# Patient Record
Sex: Female | Born: 1955 | Race: White | Hispanic: No | Smoking: Former smoker
Health system: Southern US, Community
[De-identification: ages and names within clinical notes are randomized; demographics above are authoritative.]

## PROBLEM LIST (undated history)

## (undated) DIAGNOSIS — E079 Disorder of thyroid, unspecified: Secondary | ICD-10-CM

## (undated) DIAGNOSIS — L900 Lichen sclerosus et atrophicus: Secondary | ICD-10-CM

## (undated) HISTORY — PX: SEPTOPLASTY: SUR1290

## (undated) HISTORY — PX: OTHER SURGICAL HISTORY: SHX169

## (undated) HISTORY — DX: Disorder of thyroid, unspecified: E07.9

## (undated) HISTORY — DX: Lichen sclerosus et atrophicus: L90.0

---

## 2009-03-08 ENCOUNTER — Encounter: Payer: Self-pay | Admitting: Family Medicine

## 2009-03-08 DIAGNOSIS — E039 Hypothyroidism, unspecified: Secondary | ICD-10-CM | POA: Insufficient documentation

## 2009-03-08 DIAGNOSIS — M79609 Pain in unspecified limb: Secondary | ICD-10-CM | POA: Insufficient documentation

## 2010-03-26 NOTE — Assessment & Plan Note (Signed)
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 CONTINUED TO WORK. AS THE DAY CONTINUED THE PAIN INCREASED. STATES SHE CANNOT GRIP OR LIFT WITH HER LEFT HAND. SHE ALSO HAS SOME STIFFNESS IN HER LEFT ELBOW AND SHOULDER. DENIES ANY NUMBNESS OR TINGLING.   Current Problems: HAND PAIN, LEFT (ICD-729.5) FALL FROM OTHER SLIPPING TRIPPING OR STUMBLING (ICD-E885.9) HYPOTHYROIDISM (ICD-244.9)   Current Meds LEVOTHYROXINE SODIUM 125 MCG TABS (LEVOTHYROXINE SODIUM) 1 tab by mouth once daily  REVIEW OF SYSTEMS Constitutional Symptoms      Denies fever, chills, night sweats, weight loss, weight gain, and fatigue.  Eyes       Denies change in vision, eye pain, eye discharge, glasses, contact lenses, and eye surgery. Ear/Nose/Throat/Mouth       Denies hearing loss/aids, change in hearing, ear pain, ear discharge, dizziness, frequent runny nose, frequent nose bleeds, sinus problems, sore throat, hoarseness, and tooth pain or bleeding.  Respiratory       Denies dry cough, productive cough, wheezing, shortness of breath, asthma, bronchitis, and emphysema/COPD.  Cardiovascular       Denies murmurs, chest pain, and tires easily with exhertion.    Gastrointestinal       Denies stomach pain, nausea/vomiting, diarrhea, constipation, blood in bowel movements, and indigestion. Genitourniary       Denies painful urination, kidney stones, and  loss of urinary control. Neurological       Denies paralysis, seizures, and fainting/blackouts. Musculoskeletal       Complains of muscle pain, decreased range of motion, redness, and swelling.      Denies joint pain, joint stiffness, muscle weakness, and gout.      Comments: L wrist, elbow, shoulder Skin       Denies bruising, unusual mles/lumps or sores, and hair/skin or nail changes.  Psych       Denies mood changes, temper/anger issues, anxiety/stress, speech problems, depression, and sleep problems. 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   Past History:  Past Medical History: Hypothyroidism Physical Exam General appearance: well developed, well nourished, no acute distress Extremities: PAIN AND SWELLING DORSAL LEFT WRIST AND HAND. ROM GROSSLY INTACT BUT PAINFUL. N/V INTACT. PAIN WITH GRIPPING AND WITH SUPINATION AND PRONATION. NO PAIN TO PALP OVER THE RADIAL HEAD. EVAL OF THE LEFT SHOULDER REVEALS STIFFNESS BUT INTACT.  Neurological: grossly intact and non-focal WILL CHECK XRAY OR THE LEFT WRIST AND HAND  XRAYS APPERAR NEGATIVE Assessment New Problems: HAND PAIN, LEFT (ICD-729.5) FALL FROM OTHER SLIPPING TRIPPING OR STUMBLING (ICD-E885.9) HYPOTHYROIDISM (ICD-244.9)   Plan New Orders: T-DG Hand Complete*L* [73130] T-*Unlisted Diagnostic X-ray test/procedure [58527] New Patient Level III [  99203]    Patient Instructions: 1)  WEAR SPLINT FOR COMFORT. APPLLY ICE INTERMITTANTLY. MAY DO REG DUTY AST TOLERATED. MOTRIN OR ALEVE AS NEEDED. FOLLOW UP AS NEEDED.

## 2010-08-12 ENCOUNTER — Encounter: Payer: Self-pay | Admitting: Family Medicine

## 2010-08-12 ENCOUNTER — Inpatient Hospital Stay (INDEPENDENT_AMBULATORY_CARE_PROVIDER_SITE_OTHER)
Admission: RE | Admit: 2010-08-12 | Discharge: 2010-08-12 | Disposition: A | Discharge status: Home / Self Care | Payer: Federal, State, Local not specified - PPO | Source: Ambulatory Visit | Attending: Family Medicine | Admitting: Family Medicine

## 2010-08-12 DIAGNOSIS — H6990 Unspecified Eustachian tube disorder, unspecified ear: Secondary | ICD-10-CM | POA: Insufficient documentation

## 2010-08-12 DIAGNOSIS — J019 Acute sinusitis, unspecified: Secondary | ICD-10-CM

## 2010-08-12 DIAGNOSIS — H6980 Other specified disorders of Eustachian tube, unspecified ear: Secondary | ICD-10-CM

## 2010-08-12 DIAGNOSIS — J00 Acute nasopharyngitis [common cold]: Secondary | ICD-10-CM

## 2011-01-27 NOTE — Progress Notes (Signed)
Summary: SORE THROAT,EARACHES,HEADACHE rm 4   Vital Signs:  Patient Profile:   55 Years Old Female CC:      sore throat, ear ache, and HA  x 2 days Height:     67 inches Weight:      203.50 pounds O2 Sat:      98 % O2 treatment:    Room Air Temp:     97.9 degrees F oral Pulse rate:   69 / minute Resp:     18 per minute BP sitting:   126 / 86  (left arm) Cuff size:   regular  Vitals Entered By: Clemens Catholic LPN (August 12, 2010 4:59 PM)                  Prior Medication List:  LEVOTHYROXINE SODIUM 125 MCG TABS (LEVOTHYROXINE SODIUM) 1 tab by mouth once daily   Updated Prior Medication List: LEVOTHYROXINE SODIUM 75 MCG TABS (LEVOTHYROXINE SODIUM)   Current Allergies (reviewed today): No known allergies History of Present Illness Chief Complaint: sore throat, ear ache, and HA  x 2 days History of Present Illness: Sore throatt nasal congestion started Saturday actually felt better yesterday and the today she had arebound and started felling worse today. She reports swollen lymph node on her L neck that has felt like it has gotten a lot worse.  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) ACUTE SINUSITIS, UNSPECIFIED (ICD-461.9) ACUTE NASOPHARYNGITIS (ICD-460) HAND PAIN, LEFT (ICD-729.5) FALL FROM OTHER SLIPPING TRIPPING OR STUMBLING (ICD-E885.9) HYPOTHYROIDISM (ICD-244.9)   Current Meds LEVOTHYROXINE SODIUM 75 MCG TABS (LEVOTHYROXINE SODIUM)  AUGMENTIN 875-125 MG TABS (AMOXICILLIN-POT CLAVULANATE) 1 by mouth 2 times daily DIFLUCAN 200 MG TABS (FLUCONAZOLE) 1 by mouth beginning of antibiotic treatment and repeat if needed ALLEGRA-D ALLERGY & CONGESTION 180-240 MG XR24H-TAB (FEXOFENADINE-PSEUDOEPHEDRINE) 1 by mouth q day FLONASE 50 MCG/ACT SUSP (FLUTICASONE PROPIONATE) 2 puffs each nostril q day  REVIEW OF SYSTEMS Constitutional Symptoms       Complains of fever, chills, and night sweats.     Denies weight loss, weight gain, and fatigue.  Eyes       Denies change in vision, eye pain, eye discharge, glasses, contact lenses, and eye surgery. Ear/Nose/Throat/Mouth       Complains of ear pain, frequent runny nose, sinus problems, and sore throat.      Denies hearing loss/aids, change in hearing, ear discharge, dizziness, frequent nose bleeds, hoarseness, and tooth pain or bleeding.  Respiratory       Denies dry cough, productive cough, wheezing, shortness of breath, asthma, bronchitis, and emphysema/COPD.  Cardiovascular       Complains of chest pain.      Denies murmurs and tires easily with exhertion.    Gastrointestinal       Denies stomach pain, nausea/vomiting, diarrhea, constipation, blood in bowel movements, and indigestion. Genitourniary       Denies painful urination, kidney stones, and loss of urinary control. Neurological       Complains of headaches.      Denies paralysis, seizures, and fainting/blackouts. Musculoskeletal       Denies muscle pain, joint pain, joint stiffness, decreased range of motion, redness, swelling, muscle weakness, and gout.  Skin       Denies bruising, unusual mles/lumps or sores, and hair/skin or nail changes.  Psych       Denies mood changes, temper/anger issues, anxiety/stress, speech problems, depression, and sleep problems. Blood-Lymph       Complains of unexplained lumps. Other Comments:  pt c/o sore throat, swollen lymph node on LT side of her neck, head congestion and HA x 2 days.   Past History:  Family History: Last updated: 08/12/2010 father- kidney failure, heart blockage, prostate CA- deceased age 21  Social History: Last updated: 08/12/2010 Stopped Smoking 6 years ago Alcohol use-yes 2-3 per wk Drug use-no  Risk Factors: Smoking Status: quit > 6 months (08/12/2010)  Past Medical History: Reviewed history from 03/08/2009 and no changes required. Hypothyroidism  Past Surgical History: Caesarean section x 2 86'/93'    Family History: Reviewed history and no changes  required. father- kidney failure, heart blockage, prostate CA- deceased age 7  Social History: Reviewed history and no changes required. 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  Plan New Medications/Changes: FLONASE 50 MCG/ACT SUSP (FLUTICASONE PROPIONATE) 2 puffs each nostril q day  #1 x 0, 08/12/2010, Hassan Rowan MD ALLEGRA-D ALLERGY & CONGESTION 180-240 MG XR24H-TAB (FEXOFENADINE-PSEUDOEPHEDRINE) 1 by mouth q day  #30 x 0, 08/12/2010, Hassan Rowan MD DIFLUCAN 200 MG TABS (FLUCONAZOLE) 1 by mouth beginning of antibiotic treatment and repeat if needed  #2 x 0, 08/12/2010, Hassan Rowan MD AUGMENTIN [phone] MG TABS (AMOXICILLIN-POT CLAVULANATE) 1 by mouth 2 times daily  #20 x 0, 08/12/2010, Hassan Rowan MD  New Orders: New Patient Level III [14782] Rapid Strep [95621] Planning Comments:   as below  Follow Up: Follow up in 2-3 days if no improvement, Follow up on an as needed basis, Follow up with Primary Physician Work/School Excuse: Return to work/school in 2 days  The patient and/or caregiver has been counseled thoroughly with regard to medications prescribed including dosage, schedule, interactions, rationale for use, and possible  side effects and they verbalize understanding.  Diagnoses and expected course of recovery discussed and will return if not improved as expected or if the condition worsens. Patient and/or caregiver verbalized understanding.  Prescriptions: FLONASE 50 MCG/ACT SUSP (FLUTICASONE PROPIONATE) 2 puffs each nostril q day  #1 x 0   Entered and Authorized by:   Hassan Rowan MD   Signed by:   Hassan Rowan MD on 08/12/2010   Method used:   Print then Give to Patient   RxID:   [id number] ALLEGRA-D ALLERGY & CONGESTION 180-240 MG XR24H-TAB (FEXOFENADINE-PSEUDOEPHEDRINE) 1 by mouth q day  #30 x 0   Entered and Authorized by:   Hassan Rowan MD   Signed by:   Hassan Rowan MD on 08/12/2010   Method used:   Print then Give to Patient   RxID:   [id number] DIFLUCAN 200 MG TABS (FLUCONAZOLE) 1 by mouth beginning of antibiotic treatment and repeat if needed  #2 x 0   Entered and Authorized by:   Hassan Rowan MD   Signed by:   Hassan Rowan MD on 08/12/2010   Method used:   Print then Give to Patient  RxID:   [id number] AUGMENTIN 875-125 MG TABS (AMOXICILLIN-POT CLAVULANATE) 1 by mouth 2 times daily  #20 x 0   Entered and Authorized by:   Hassan Rowan MD   Signed by:   Hassan Rowan MD on 08/12/2010   Method used:   Print then Give to Patient   RxID:   [id number]   Patient Instructions: 1)  Recommended remaining out of work for tomorrow and Wednesday if needed. If better on Wednesday may return. 2)  Take your antibiotic as prescribed until ALL of it is gone, but stop if you develop a rash or swelling and contact our office as soon as possible. 3)  Acute sinusitis symptoms for less than 10 days are not helped by antibiotics.Use warm moist compresses, and over the counter decongestants ( only as directed). Call if no improvement in 5-7 days, sooner if increasing pain, fever, or new symptoms. 4)  Please schedule a follow-up appointment as needed. 5)  Please schedule an appointment with your primary  doctor in :7-14 days if not better.  Orders Added: 1)  New Patient Level III [99203] 2)  Rapid Strep [74259]    Laboratory Results  Date/Time Received: August 12, 2010 5:25 PM  Date/Time Reported: August 12, 2010 5:25 PM   Other Tests  Rapid Strep: negative  Kit Test Internal QC: Negative   (Normal Range: Negative)   Preventive Screening-Counseling & Management  Alcohol-Tobacco     Smoking Status: quit > 6 months      Drug Use:  no.

## 2011-01-27 NOTE — Letter (Signed)
Summary: Out of Work  [facility]  [address]   [place], [place] [address]   Phone: [phone]  Fax: [phone]    August 12, 2010   Employee:  Kristy Jones    To Whom It May Concern:   For Medical reasons, please excuse the above named employee from work for the following dates:  Start:   08/12/2010  Return back to work if earliest on 06/20 and no latwer than 08/15/2010:    If you need additional information, please feel free to contact our office.         Sincerely,    Hassan Rowan MD

## 2011-06-06 ENCOUNTER — Ambulatory Visit (INDEPENDENT_AMBULATORY_CARE_PROVIDER_SITE_OTHER): Payer: Federal, State, Local not specified - PPO | Admitting: Physician Assistant

## 2011-06-06 ENCOUNTER — Encounter: Payer: Self-pay | Admitting: Physician Assistant

## 2011-06-06 ENCOUNTER — Other Ambulatory Visit: Payer: Self-pay | Admitting: Physician Assistant

## 2011-06-06 VITALS — BP 122/83 | HR 72 | Ht 67.5 in | Wt 207.0 lb

## 2011-06-06 DIAGNOSIS — K5900 Constipation, unspecified: Secondary | ICD-10-CM

## 2011-06-06 DIAGNOSIS — F419 Anxiety disorder, unspecified: Secondary | ICD-10-CM

## 2011-06-06 DIAGNOSIS — Z7689 Persons encountering health services in other specified circumstances: Secondary | ICD-10-CM

## 2011-06-06 DIAGNOSIS — E039 Hypothyroidism, unspecified: Secondary | ICD-10-CM

## 2011-06-06 DIAGNOSIS — F411 Generalized anxiety disorder: Secondary | ICD-10-CM

## 2011-06-06 DIAGNOSIS — G4700 Insomnia, unspecified: Secondary | ICD-10-CM

## 2011-06-06 MED ORDER — TRAZODONE HCL 50 MG PO TABS: 50.0000 mg | ORAL_TABLET | Freq: Every day | ORAL | Status: DC

## 2011-06-06 MED ORDER — CLONAZEPAM 0.25 MG PO TBDP: 0.2500 mg | ORAL_TABLET | Freq: Two times a day (BID) | ORAL | Status: DC | PRN

## 2011-06-06 NOTE — Patient Instructions (Addendum)
Miralax with stool softeners. Will call with lab results. Start Trazodone at night to help with sleep and anxiety. Can also use Clonazepam as needed up to twice a day for anxiety. Follow up in 6-8 weeks.

## 2011-06-07 LAB — THYROID PANEL WITH TSH
Free Thyroxine Index: 1.5 (ref 1.0–3.9)
T3 Uptake: 37.4 % — ABNORMAL HIGH (ref 22.5–37.0)
T4, Total: 4.1 ug/dL — ABNORMAL LOW (ref 5.0–12.5)
TSH: 3.233 u[IU]/mL (ref 0.350–4.500)

## 2011-06-09 NOTE — Progress Notes (Signed)
  Subjective:    Patient ID: Kristy Jones, female    DOB: Jul 14, 1955, 56 y.o.   MRN: [id number]  HPI Patient presents to establish care. She has a dx of hypothyroidism but takes a natural medication T-100 from [facility] store that has iodine in it and not levothyroxine. She cut back to 1 tab because 2 tabs was making her too hyperthryoid.She needs bloodwork today. She is also having a lot of problems with sleep and anxiety. Once she gets to sleep she is able to stay asleep but hard to shut down. She has recently felt more anxious and in the past took klonapin. She has recently had some constipation. She has not tried anything to make better.    Review of Systems     Objective:   Physical Exam  Constitutional: She is oriented to person, place, and time. She appears well-developed and well-nourished.  HENT:  Head: Normocephalic and atraumatic.  Cardiovascular: Normal rate, regular rhythm and normal heart sounds.   Pulmonary/Chest: Effort normal and breath sounds normal. She has no wheezes.  Abdominal: Soft. Bowel sounds are normal. She exhibits no distension. There is no tenderness.  Neurological: She is alert and oriented to person, place, and time.  Skin: Skin is warm and dry.  Psychiatric: She has a normal mood and affect. Her behavior is normal.          Assessment & Plan:  Hypothyroidism-Thyroid panel was ordered will call with result. She takes Iodine(T-100) 1 tab daily.  Constipation-Miralax with stool softeners may help.   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 Trazodone was given to help with both. Gave klonapin to take only as needed no more than twice a day for anxiety. Follow up in 6-8 weeks.

## 2011-06-10 ENCOUNTER — Telehealth: Payer: Self-pay | Admitting: *Deleted

## 2011-06-10 NOTE — Telephone Encounter (Signed)
Pt called for results of her thyroid panel. Please advise.

## 2011-06-10 NOTE — Telephone Encounter (Signed)
Pt informed. Mailed copy of lab results per pt request to [address] [place], [place] [address]

## 2011-06-10 NOTE — Telephone Encounter (Signed)
Let patient know TSH and reverse t3 is within normal limits. Total t4 and t3 uptake minimally outside of range. Continue on T-100 supplement and need to recheck in 6 months.

## 2011-07-23 ENCOUNTER — Other Ambulatory Visit: Payer: Self-pay | Admitting: *Deleted

## 2011-07-23 MED ORDER — CLONAZEPAM 0.25 MG PO TBDP: 0.2500 mg | ORAL_TABLET | Freq: Two times a day (BID) | ORAL | Status: DC | PRN

## 2011-07-24 ENCOUNTER — Encounter: Payer: Self-pay | Admitting: *Deleted

## 2011-08-01 ENCOUNTER — Ambulatory Visit (INDEPENDENT_AMBULATORY_CARE_PROVIDER_SITE_OTHER): Payer: Federal, State, Local not specified - PPO | Admitting: Physician Assistant

## 2011-08-01 ENCOUNTER — Encounter: Payer: Self-pay | Admitting: Physician Assistant

## 2011-08-01 VITALS — BP 108/72 | HR 60 | Ht 67.5 in | Wt 188.0 lb

## 2011-08-01 DIAGNOSIS — F419 Anxiety disorder, unspecified: Secondary | ICD-10-CM

## 2011-08-01 DIAGNOSIS — F411 Generalized anxiety disorder: Secondary | ICD-10-CM

## 2011-08-01 DIAGNOSIS — E039 Hypothyroidism, unspecified: Secondary | ICD-10-CM

## 2011-08-01 DIAGNOSIS — Z13220 Encounter for screening for lipoid disorders: Secondary | ICD-10-CM

## 2011-08-01 DIAGNOSIS — Z79899 Other long term (current) drug therapy: Secondary | ICD-10-CM

## 2011-08-01 DIAGNOSIS — K5900 Constipation, unspecified: Secondary | ICD-10-CM

## 2011-08-01 LAB — LIPID PANEL
Cholesterol: 152 mg/dL (ref 0–200)
HDL: 49 mg/dL (ref >39)
Total CHOL/HDL Ratio: 3.1 Ratio
VLDL: 14 mg/dL (ref 0–40)

## 2011-08-01 LAB — T3 UPTAKE: T3 Uptake: 41 % — ABNORMAL HIGH (ref 22.5–37.0)

## 2011-08-01 LAB — T4, FREE: Free T4: 0.83 ng/dL (ref 0.80–1.80)

## 2011-08-01 MED ORDER — AMBULATORY NON FORMULARY MEDICATION: Status: DC

## 2011-08-01 MED ORDER — CLONAZEPAM 0.25 MG PO TBDP: 0.2500 mg | ORAL_TABLET | Freq: Two times a day (BID) | ORAL | Status: DC | PRN

## 2011-08-01 NOTE — Patient Instructions (Addendum)
Will call with lab results. Continue taking meds. Follow up in 6 months. Only use klonapin as needed.

## 2011-08-01 NOTE — Progress Notes (Signed)
  Subjective:    Patient ID: Kristy Jones, female    DOB: 1955-04-15, 56 y.o.   MRN: [id number]  HPI Patient present to clinic to follow up on hypothyroidism and anxiety. At last visit T4 levels were a little low so patient increase natural iodine supplement to 1 1/2 daily. Will recheck levels today and she if they have changes. Patient reports to be feeling ok despite a lot of stress in her life. Her youngest son has been in and out of prison and causing a lot of problems. She has had to start using klonapin as needed again. She does not use every day but has been using at least 3 times a week. Denies any CP, palpitations, SOB, swelling, or skin changes. She also denies any suicidal thoughts.  Constipation has resolved and doing much better with supplements with magnesium. She is scheduled to have her yearly mammogram and pap in July by Dr. Fredderick Phenix at [facility].    Review of Systems     Objective:   Physical Exam  Constitutional: She is oriented to person, place, and time. She appears well-developed and well-nourished.  HENT:  Head: Normocephalic and atraumatic.  Neck: Normal range of motion. Neck supple. No thyromegaly present.  Cardiovascular: Normal rate, regular rhythm, normal heart sounds and intact distal pulses.   Pulmonary/Chest: Effort normal and breath sounds normal. She has no wheezes.  Neurological: She is alert and oriented to person, place, and time.  Skin: Skin is warm and dry.       No swelling of extremities.  Psychiatric: She has a normal mood and affect. Her behavior is normal.          Assessment & Plan:  Hypothyroidism- Will recheck labs to determine if we need to make changes to supplements. As of now continue on 1 1/2 tabs and we will let you know what to do from here. Follow up in 6 months.   Anxiety- Discussed going on an every day medication during this stressful peroid. She declined at this time. She states that she will only take klonapin as  needed. Klonapin refilled.   Constipation- Doing great on natural supplements that she has found with magnesium in them.   Needs lipid panel and recheck Vit D level since taking Vit d.

## 2011-10-14 ENCOUNTER — Other Ambulatory Visit: Payer: Self-pay | Admitting: Physician Assistant

## 2011-10-16 ENCOUNTER — Other Ambulatory Visit: Payer: Self-pay | Admitting: *Deleted

## 2011-10-16 MED ORDER — TRAZODONE HCL 50 MG PO TABS: 50.0000 mg | ORAL_TABLET | Freq: Every day | ORAL | Status: DC

## 2011-11-05 ENCOUNTER — Other Ambulatory Visit: Payer: Self-pay | Admitting: Physician Assistant

## 2011-11-05 ENCOUNTER — Other Ambulatory Visit: Payer: Self-pay | Admitting: *Deleted

## 2011-11-05 MED ORDER — IBUPROFEN 800 MG PO TABS: 800.0000 mg | ORAL_TABLET | Freq: Three times a day (TID) | ORAL | Status: DC | PRN

## 2011-11-05 NOTE — Telephone Encounter (Signed)
Sent to pharmacy 

## 2011-11-05 NOTE — Telephone Encounter (Signed)
Pt informed

## 2011-11-05 NOTE — Telephone Encounter (Signed)
Pt states that when she seen you that she forgot to mention that she has been taking Ibuprofen 800mg  for her pain. She is asking if we can send an rx to pharmacy b/c she is out. Please advise.

## 2011-12-12 ENCOUNTER — Other Ambulatory Visit: Payer: Self-pay | Admitting: *Deleted

## 2011-12-12 ENCOUNTER — Other Ambulatory Visit: Payer: Self-pay | Admitting: Physician Assistant

## 2011-12-12 MED ORDER — TRAZODONE HCL 50 MG PO TABS: 50.0000 mg | ORAL_TABLET | Freq: Every day | ORAL | Status: DC

## 2012-01-15 ENCOUNTER — Telehealth: Payer: Self-pay | Admitting: *Deleted

## 2012-01-15 ENCOUNTER — Other Ambulatory Visit: Payer: Self-pay | Admitting: Family Medicine

## 2012-01-15 MED ORDER — TRAZODONE HCL 100 MG PO TABS: 100.0000 mg | ORAL_TABLET | Freq: Every day | ORAL | Status: DC

## 2012-01-15 NOTE — Telephone Encounter (Signed)
Pt notified via vm

## 2012-01-15 NOTE — Telephone Encounter (Signed)
OK to increase to 100mg  nightly. Keep dec appt.

## 2012-01-15 NOTE — Telephone Encounter (Signed)
Pt calls and states that thought Lesly Rubenstein was gonna increase the trazadone she takes at bedtime to 100mg  nightly instead of the 50mg . This didn't happen and pt is out because she has been taking 2 of the trazadone 50mg .

## 2012-02-10 ENCOUNTER — Ambulatory Visit: Payer: Federal, State, Local not specified - PPO | Admitting: Family Medicine

## 2012-02-13 ENCOUNTER — Ambulatory Visit (INDEPENDENT_AMBULATORY_CARE_PROVIDER_SITE_OTHER): Payer: Federal, State, Local not specified - PPO | Admitting: Physician Assistant

## 2012-02-13 ENCOUNTER — Ambulatory Visit: Payer: Federal, State, Local not specified - PPO | Admitting: Physician Assistant

## 2012-02-13 ENCOUNTER — Encounter: Payer: Self-pay | Admitting: Physician Assistant

## 2012-02-13 VITALS — BP 140/86 | HR 63 | Wt 209.0 lb

## 2012-02-13 DIAGNOSIS — E039 Hypothyroidism, unspecified: Secondary | ICD-10-CM

## 2012-02-13 DIAGNOSIS — F411 Generalized anxiety disorder: Secondary | ICD-10-CM

## 2012-02-13 DIAGNOSIS — G4700 Insomnia, unspecified: Secondary | ICD-10-CM

## 2012-02-13 DIAGNOSIS — F419 Anxiety disorder, unspecified: Secondary | ICD-10-CM

## 2012-02-13 MED ORDER — CLONAZEPAM 0.5 MG PO TABS: 0.5000 mg | ORAL_TABLET | Freq: Two times a day (BID) | ORAL | Status: DC | PRN

## 2012-02-13 MED ORDER — TRAZODONE HCL 100 MG PO TABS: 100.0000 mg | ORAL_TABLET | Freq: Every day | ORAL | Status: DC

## 2012-02-13 NOTE — Progress Notes (Signed)
  Subjective:    Patient ID: Kristy Jones, female    DOB: Jul 24, 1955, 56 y.o.   MRN: [id number]  HPI Patient presents to the clinic to get refills. Patient feels great today. She is taking T-100 1 and 1/2 tabs daily. Last TSH check was in JUne and was normal.    She also is taking Trazodone for sleep.She reports that works great for sleep and takes every night.   Anxiety is ongoing and  uses klonapin as needed for anxiety. She reports only using 2-3 times a week. She needs refill on all of medication.    Review of Systems     Objective:   Physical Exam  Constitutional: She is oriented to person, place, and time. She appears well-developed and well-nourished.       Overweight  HENT:  Head: Normocephalic and atraumatic.  Eyes: Conjunctivae normal are normal.  Neck: Normal range of motion. Neck supple. No thyromegaly present.  Cardiovascular: Normal rate, regular rhythm and normal heart sounds.   Pulmonary/Chest: Effort normal and breath sounds normal.  Neurological: She is alert and oriented to person, place, and time.  Skin: Skin is warm and dry.  Psychiatric: She has a normal mood and affect. Her behavior is normal.          Assessment & Plan:  Hypothyroidism- Will not recheck today since patient is feeling so good. Stay on T-100 1 tab and 1/2 daily. Will recheck in 6 months.   Insomnia- Refilled Trazodone. Discussed good sleep hygiene.  Anxiety- Refilled klonopin. Discussed to only use when needed for acute attacks. Discussed if starting to have to use every day then need to discuss every day preventative medication.   Discussed flu shot but she wants to go where shot is free so will [facility].

## 2012-02-13 NOTE — Patient Instructions (Addendum)
Stay on 1 and half tabs daily of T-100. Follow up in 6 months.

## 2012-03-15 ENCOUNTER — Other Ambulatory Visit: Payer: Self-pay | Admitting: Physician Assistant

## 2012-06-08 ENCOUNTER — Telehealth: Payer: Self-pay | Admitting: *Deleted

## 2012-06-08 NOTE — Telephone Encounter (Signed)
Pt calls and states her alcoholic son has been on a binge for 4 days and caused her to be stressed out and she doesn't have the money for an office visit.  She wants to know if you will give her a doctor's note for the days she took off work to give to the [employer]. Says they are requiring her to have one for when she was out. Says you know her situation with her son and their issues. Told patient she would have to be seen but she asked if I would just ask you and see

## 2012-06-09 NOTE — Telephone Encounter (Signed)
How many days? I am ok with 1-2 days. But I can only write a note. If they ask me to fill out FMLA paperwork. I cannot do that by law without a office visit to access you. Will the note be sufficient? Also I will not write out anymore than 2 days.

## 2012-08-03 ENCOUNTER — Other Ambulatory Visit: Payer: Self-pay | Admitting: Family Medicine

## 2012-08-03 ENCOUNTER — Telehealth: Payer: Self-pay | Admitting: *Deleted

## 2012-08-03 DIAGNOSIS — E039 Hypothyroidism, unspecified: Secondary | ICD-10-CM

## 2012-08-03 NOTE — Telephone Encounter (Signed)
Labs ordered & faxed

## 2012-08-04 LAB — TSH: TSH: 3.508 u[IU]/mL (ref 0.350–4.500)

## 2012-08-04 LAB — T3 UPTAKE: T3 Uptake: 39.6 % — ABNORMAL HIGH (ref 22.5–37.0)

## 2012-08-04 LAB — T4, FREE: Free T4: 0.75 ng/dL — ABNORMAL LOW (ref 0.80–1.80)

## 2012-08-04 LAB — T3, FREE: T3, Free: 2.9 pg/mL (ref 2.3–4.2)

## 2012-08-09 ENCOUNTER — Ambulatory Visit: Payer: Federal, State, Local not specified - PPO | Admitting: Physician Assistant

## 2012-09-27 ENCOUNTER — Other Ambulatory Visit: Payer: Self-pay | Admitting: Physician Assistant

## 2012-09-27 ENCOUNTER — Ambulatory Visit (INDEPENDENT_AMBULATORY_CARE_PROVIDER_SITE_OTHER): Payer: Federal, State, Local not specified - PPO | Admitting: Physician Assistant

## 2012-09-27 ENCOUNTER — Encounter: Payer: Self-pay | Admitting: Physician Assistant

## 2012-09-27 VITALS — BP 121/75 | HR 85 | Wt 215.0 lb

## 2012-09-27 DIAGNOSIS — F419 Anxiety disorder, unspecified: Secondary | ICD-10-CM

## 2012-09-27 DIAGNOSIS — F411 Generalized anxiety disorder: Secondary | ICD-10-CM

## 2012-09-27 DIAGNOSIS — G4700 Insomnia, unspecified: Secondary | ICD-10-CM

## 2012-09-27 DIAGNOSIS — E039 Hypothyroidism, unspecified: Secondary | ICD-10-CM

## 2012-09-27 MED ORDER — CITALOPRAM HYDROBROMIDE 10 MG PO TABS: 10.0000 mg | ORAL_TABLET | Freq: Every day | ORAL | Status: DC

## 2012-09-27 NOTE — Progress Notes (Signed)
  Subjective:    Patient ID: Kristy Jones, female    DOB: February 28, 1955, 57 y.o.   MRN: [id number]  HPI Patient presents to the clinic to followup on anxiety, hypothyroidism, insomnia.  Anxiety has worsened over the past 3 months. Her son is on the run from the law. She has not seen him in over a month. He is in trouble for drug use and DUI. She is in constant anxiety and stress over his future. She has a lot of problems sleeping. Trazodone does help but she wakes up feeling groggy in the morning. She has not used trazodone in the last 4-6 weeks. She occasionally does use Klonopin patient has tried daily medication for anxiety in the past but is not on anything currently.  Hypothyroidism-patient recently increase to 2 tabs of her natural thyroid medication. Her free T4 was just a little bit low at last visit. We will recheck today to see if we'll have normalize.   Review of Systems     Objective:   Physical Exam  Constitutional: She is oriented to person, place, and time. She appears well-developed and well-nourished.  HENT:  Head: Normocephalic and atraumatic.  Neck: Normal range of motion. Neck supple. No thyromegaly present.  Cardiovascular: Normal rate, regular rhythm and normal heart sounds.   Pulmonary/Chest: Effort normal and breath sounds normal. She has no wheezes.  Neurological: She is alert and oriented to person, place, and time.  Skin: Skin is warm and dry.  Psychiatric:  Patient got multiple times during the encounter talking about her son.          Assessment & Plan:  Anxiety/insomnia- GAD-7 was 15. Started patient on Celexa at bedtime to see if would help her get to sleep muscles control ongoing anxiety issues. Patient aware of side effects of SSRIs. Patient does not want to be on trazodone and does not want to try Ambien at this time. Told her she could try to use Benadryl as needed to also help with sleep. Mention counseling; however, patient has tried numerous times  and said really does not help. Followup in 2 months.  Hypothyroidism-we'll recheck TSH and free T4 today.

## 2012-09-28 LAB — TSH: TSH: 0.037 u[IU]/mL — ABNORMAL LOW (ref 0.350–4.500)

## 2012-10-07 ENCOUNTER — Telehealth: Payer: Self-pay | Admitting: *Deleted

## 2012-10-07 NOTE — Telephone Encounter (Signed)
Pt notified of MD instructions via VM. Barry Dienes, LPN

## 2012-10-07 NOTE — Telephone Encounter (Signed)
I am assuming this is the celexa. Yes, can take 2 a day. Keep f/u appt.

## 2012-10-07 NOTE — Telephone Encounter (Signed)
Pt calls and states that you put her on med to help with her sleep and anxiety and 1 was not working so she is taking 2 of these a day and wanted to know if this was ok to do as she has been doing it.

## 2012-12-13 ENCOUNTER — Ambulatory Visit: Payer: Federal, State, Local not specified - PPO | Admitting: Physician Assistant

## 2012-12-29 ENCOUNTER — Encounter: Payer: Self-pay | Admitting: Physician Assistant

## 2012-12-29 ENCOUNTER — Ambulatory Visit (INDEPENDENT_AMBULATORY_CARE_PROVIDER_SITE_OTHER): Payer: Federal, State, Local not specified - PPO | Admitting: Physician Assistant

## 2012-12-29 VITALS — BP 118/78 | HR 68 | Wt 218.0 lb

## 2012-12-29 DIAGNOSIS — E039 Hypothyroidism, unspecified: Secondary | ICD-10-CM

## 2012-12-29 DIAGNOSIS — Z23 Encounter for immunization: Secondary | ICD-10-CM

## 2012-12-29 DIAGNOSIS — Z13220 Encounter for screening for lipoid disorders: Secondary | ICD-10-CM

## 2012-12-29 DIAGNOSIS — Z131 Encounter for screening for diabetes mellitus: Secondary | ICD-10-CM

## 2012-12-29 LAB — LIPID PANEL
HDL: 56 mg/dL (ref >39)
LDL Cholesterol: 101 mg/dL — ABNORMAL HIGH (ref 0–99)
Triglycerides: 54 mg/dL (ref <150)
VLDL: 11 mg/dL (ref 0–40)

## 2012-12-29 LAB — COMPLETE METABOLIC PANEL WITH GFR
ALT: 13 U/L (ref 0–35)
AST: 15 U/L (ref 0–37)
CO2: 25 mEq/L (ref 19–32)
Calcium: 9.5 mg/dL (ref 8.4–10.5)
Chloride: 106 mEq/L (ref 96–112)
Creat: 0.61 mg/dL (ref 0.50–1.10)
GFR, Est African American: >89 mL/min
Sodium: 141 mEq/L (ref 135–145)
Total Protein: 6.6 g/dL (ref 6.0–8.3)

## 2012-12-29 LAB — TSH: TSH: 0.058 u[IU]/mL — ABNORMAL LOW (ref 0.350–4.500)

## 2012-12-29 LAB — T4, FREE: Free T4: 0.96 ng/dL (ref 0.80–1.80)

## 2012-12-29 MED ORDER — CLONAZEPAM 0.5 MG PO TABS: 0.5000 mg | ORAL_TABLET | Freq: Two times a day (BID) | ORAL | Status: DC | PRN

## 2012-12-29 MED ORDER — IBUPROFEN 800 MG PO TABS: 800.0000 mg | ORAL_TABLET | Freq: Three times a day (TID) | ORAL | Status: DC | PRN

## 2012-12-29 NOTE — Progress Notes (Signed)
  Subjective:    Patient ID: Kristy Jones, female    DOB: 1956-02-25, 57 y.o.   MRN: [id number]  HPI Patient presents to the clinic to follow up and get med refills.   Hypothyroidism- doing well today continues on T-100. She has gained another 3lbs since August. She is wanting to lose weight. She denies watching what she eats or any exercise.   Anxiety/insomnia- she stopped taking celexa due to nausea. She has started melatonin at bedtime and has helped with sleep. She feels much less stressed today. Both boys are doing much better. Oldest luke has just got out of a 90 day rehab program for narcotic addiction and living with her. He is doing well but a daily battle. She continues to take klonapin as needed. Usually a couple times a week.    Review of Systems     Objective:   Physical Exam  Constitutional: She is oriented to person, place, and time. She appears well-developed and well-nourished.  Overweight.  HENT:  Head: Normocephalic and atraumatic.  Neck: Normal range of motion. Neck supple. No thyromegaly present.  Cardiovascular: Normal rate, regular rhythm and normal heart sounds.   Pulmonary/Chest: Effort normal and breath sounds normal.  Neurological: She is alert and oriented to person, place, and time.  Skin: Skin is warm and dry.  Psychiatric: She has a normal mood and affect. Her behavior is normal.          Assessment & Plan:  Hypothyroidism- Recheck Labs.   Insomnia/Anxiety- GAD-7 was 6. Has decreased from 15. Pt feeling much less stressed. ONly using melatonin for sleep. Discussed to start exercising more regularly. Refilled klonapin PRN.   Depression screening- PHQ-9 was 4.   Screening lab slip given.   Mammogram scheduled for next week.   Flu shot today without complications.

## 2012-12-29 NOTE — Addendum Note (Signed)
Addended by: Donne Anon L on: 12/29/2012 10:42 AM   Modules accepted: Orders

## 2013-02-18 ENCOUNTER — Telehealth: Payer: Self-pay | Admitting: *Deleted

## 2013-02-18 ENCOUNTER — Other Ambulatory Visit: Payer: Self-pay | Admitting: Physician Assistant

## 2013-02-18 MED ORDER — ADAPALENE 0.3 % EX GEL: 1.0000 "application " | Freq: Every day | CUTANEOUS | Status: DC

## 2013-02-18 NOTE — Telephone Encounter (Signed)
Ok sent!

## 2013-02-18 NOTE — Telephone Encounter (Signed)
Pt left message asking if you could prescribe her diferen gel 0.3%.  She states that she used this a long time ago.  Please advise.

## 2013-02-18 NOTE — Telephone Encounter (Signed)
Pt.notified

## 2013-02-25 ENCOUNTER — Telehealth: Payer: Self-pay | Admitting: *Deleted

## 2013-02-25 NOTE — Telephone Encounter (Signed)
PA approved for Differin gel. Valid 12/23/12-02/22/14.

## 2013-04-14 ENCOUNTER — Encounter: Payer: Self-pay | Admitting: Physician Assistant

## 2013-05-10 ENCOUNTER — Other Ambulatory Visit: Payer: Self-pay | Admitting: *Deleted

## 2013-05-10 MED ORDER — CLONAZEPAM 0.5 MG PO TABS: 0.5000 mg | ORAL_TABLET | Freq: Two times a day (BID) | ORAL | Status: DC | PRN

## 2013-07-01 ENCOUNTER — Ambulatory Visit: Payer: Federal, State, Local not specified - PPO | Admitting: Physician Assistant

## 2013-07-06 ENCOUNTER — Ambulatory Visit (INDEPENDENT_AMBULATORY_CARE_PROVIDER_SITE_OTHER): Payer: Federal, State, Local not specified - PPO | Admitting: Physician Assistant

## 2013-07-06 ENCOUNTER — Encounter: Payer: Self-pay | Admitting: Physician Assistant

## 2013-07-06 VITALS — BP 125/80 | HR 62 | Ht 67.0 in | Wt 230.0 lb

## 2013-07-06 DIAGNOSIS — Z79899 Other long term (current) drug therapy: Secondary | ICD-10-CM

## 2013-07-06 DIAGNOSIS — K5900 Constipation, unspecified: Secondary | ICD-10-CM

## 2013-07-06 DIAGNOSIS — E039 Hypothyroidism, unspecified: Secondary | ICD-10-CM

## 2013-07-06 LAB — COMPLETE METABOLIC PANEL WITH GFR
ALBUMIN: 4.2 g/dL (ref 3.5–5.2)
ALT: 17 U/L (ref 0–35)
AST: 16 U/L (ref 0–37)
Alkaline Phosphatase: 72 U/L (ref 39–117)
BUN: 15 mg/dL (ref 6–23)
CHLORIDE: 105 meq/L (ref 96–112)
CO2: 26 mEq/L (ref 19–32)
CREATININE: 0.62 mg/dL (ref 0.50–1.10)
Calcium: 9.4 mg/dL (ref 8.4–10.5)
GFR, Est African American: >89 mL/min
GFR, Est Non African American: >89 mL/min
Glucose, Bld: 101 mg/dL — ABNORMAL HIGH (ref 70–99)
POTASSIUM: 4.5 meq/L (ref 3.5–5.3)
Sodium: 138 mEq/L (ref 135–145)
TOTAL PROTEIN: 6.5 g/dL (ref 6.0–8.3)
Total Bilirubin: 0.4 mg/dL (ref 0.2–1.2)

## 2013-07-06 LAB — MAGNESIUM: Magnesium: 2 mg/dL (ref 1.5–2.5)

## 2013-07-06 NOTE — Patient Instructions (Addendum)
Miralax 1 capful every night.   Constipation, Adult Constipation is when a person has fewer than 3 bowel movements a week; has difficulty having a bowel movement; or has stools that are dry, hard, or larger than normal. As people grow older, constipation is more common. If you try to fix constipation with medicines that make you have a bowel movement (laxatives), the problem may get worse. Long-term laxative use may cause the muscles of the colon to become weak. A low-fiber diet, not taking in enough fluids, and taking certain medicines may make constipation worse. CAUSES   Certain medicines, such as antidepressants, pain medicine, iron supplements, antacids, and water pills.   Certain diseases, such as diabetes, irritable bowel syndrome (IBS), thyroid disease, or depression.   Not drinking enough water.   Not eating enough fiber-rich foods.   Stress or travel.  Lack of physical activity or exercise.  Not going to the restroom when there is the urge to have a bowel movement.  Ignoring the urge to have a bowel movement.  Using laxatives too much. SYMPTOMS   Having fewer than 3 bowel movements a week.   Straining to have a bowel movement.   Having hard, dry, or larger than normal stools.   Feeling full or bloated.   Pain in the lower abdomen.  Not feeling relief after having a bowel movement. DIAGNOSIS  Your caregiver will take a medical history and perform a physical exam. Further testing may be done for severe constipation. Some tests may include:   A barium enema X-ray to examine your rectum, colon, and sometimes, your small intestine.  A sigmoidoscopy to examine your lower colon.  A colonoscopy to examine your entire colon. TREATMENT  Treatment will depend on the severity of your constipation and what is causing it. Some dietary treatments include drinking more fluids and eating more fiber-rich foods. Lifestyle treatments may include regular exercise. If these  diet and lifestyle recommendations do not help, your caregiver may recommend taking over-the-counter laxative medicines to help you have bowel movements. Prescription medicines may be prescribed if over-the-counter medicines do not work.  HOME CARE INSTRUCTIONS   Increase dietary fiber in your diet, such as fruits, vegetables, whole grains, and beans. Limit high-fat and processed sugars in your diet, such as Jamaica[place] fries, hamburgers, cookies, candies, and soda.   A fiber supplement may be added to your diet if you cannot get enough fiber from foods.   Drink enough fluids to keep your urine clear or pale yellow.   Exercise regularly or as directed by your caregiver.   Go to the restroom when you have the urge to go. Do not hold it.  Only take medicines as directed by your caregiver. Do not take other medicines for constipation without talking to your caregiver first. SEEK IMMEDIATE MEDICAL CARE IF:   You have bright red blood in your stool.   Your constipation lasts for more than 4 days or gets worse.   You have abdominal or rectal pain.   You have thin, pencil-like stools.  You have unexplained weight loss. MAKE SURE YOU:   Understand these instructions.  Will watch your condition.  Will get help right away if you are not doing well or get worse. Document Released: 11/09/2003 Document Revised: 05/05/2011 Document Reviewed: 11/22/2012 [facility] Patient Information 2014 [employer] [place].

## 2013-07-07 LAB — TSH: TSH: 0.05 u[IU]/mL — ABNORMAL LOW (ref 0.350–4.500)

## 2013-07-07 LAB — T4, FREE: FREE T4: 0.91 ng/dL (ref 0.80–1.80)

## 2013-07-07 LAB — VITAMIN D 25 HYDROXY (VIT D DEFICIENCY, FRACTURES): Vit D, 25-Hydroxy: 57 ng/mL (ref 30–89)

## 2013-07-07 LAB — T3, FREE: T3, Free: 3.4 pg/mL (ref 2.3–4.2)

## 2013-07-07 LAB — VITAMIN B12: Vitamin B-12: 503 pg/mL (ref 211–911)

## 2013-07-08 DIAGNOSIS — K5900 Constipation, unspecified: Secondary | ICD-10-CM | POA: Insufficient documentation

## 2013-07-08 NOTE — Progress Notes (Signed)
   Subjective:    Patient ID: Kristy Jones, female    DOB: 04/25/1955, 58 y.o.   MRN: [id number]  HPI Patient is a 58 year old female who presents to the clinic to followup on hypothyroidism. She is doing well today on her over-the-counter supplement T.-100. She has no complaints or concerns today with how she feels. 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  Patient is on a lot of supplements and is concerned about over supplementing a central nutritions.  She also does complain of some recent increase in constipation. She denies any ferrous sulfate usage. She has noticed that she's going much less frequent. She will go on aver 3 times a week and the stools are hard. She denies any blood or pus in any of her stools. She denies any fever, chills, nausea or vomiting. She does have some occasional stomach cramping. She has started magnesium citrate 500 mg twice a day. She does not notice any improvement with her bowel movements. She's previously been using stool softeners and it helped for a while but now they don't seem to be helping as much anymore.   Review of Systems     Objective:   Physical Exam  Constitutional: She is oriented to person, place, and time. She appears well-developed and well-nourished.  HENT:  Head: Normocephalic and atraumatic.  Neck: Normal range of motion. Neck supple. No thyromegaly present.  Cardiovascular: Normal rate, regular rhythm and normal heart sounds.   Pulmonary/Chest: Effort normal and breath sounds normal.  Neurological: She is alert and oriented to person, place, and time.  Skin: Skin is dry.  Psychiatric: She has a normal mood and affect. Her behavior is normal.          Assessment & Plan:   Hypothyroidism- will recheck labs today. Pt takes OTC supplement to keep thyroid in normal range.   Constipation- encouraged to take miralax 1 capful nightly until complete bowel movement. Gave handout of increasing fiber and water.  If not improving may consider linzess or amitiza.   Medication management- pt is taking a lot of vitamins and supplements. Will check b12, vitamin d, magnesium, cmp to make sure nothing is being oversupplemented.

## 2013-09-05 ENCOUNTER — Other Ambulatory Visit: Payer: Self-pay | Admitting: *Deleted

## 2013-09-05 ENCOUNTER — Other Ambulatory Visit: Payer: Self-pay | Admitting: Physician Assistant

## 2013-09-05 MED ORDER — CLONAZEPAM 0.5 MG PO TABS: 0.5000 mg | ORAL_TABLET | Freq: Two times a day (BID) | ORAL | Status: DC | PRN

## 2013-09-26 ENCOUNTER — Other Ambulatory Visit: Payer: Self-pay | Admitting: Physician Assistant

## 2013-10-24 ENCOUNTER — Telehealth: Payer: Self-pay

## 2013-10-24 MED ORDER — CLONAZEPAM 0.5 MG PO TABS: 0.5000 mg | ORAL_TABLET | Freq: Two times a day (BID) | ORAL | Status: DC | PRN

## 2013-10-24 NOTE — Telephone Encounter (Signed)
Kristy Jones has been taking T-100 supplement for her thyroid. The supplement contains Iodine 220 mcg, Dulse 400 mg, Thyroid gland 100 mg, Adrenal gland 50 mg L-tyrosine 30 mg, Bladderwrack 15 mg, [place] 15 mg, Pituitary gland 15 mg, Thymus gland 5 mg, Calcarea fluorica 6x, Lycopus virginicus 6x. This supplement is no longer manufactured and she has switched to Raw Thyroid and this contains Tyroid Tissue, Adrenal Tissue, Pituitary Tissue, Thymus Tissue, Spleen Tissue, American  Ginseng, Kelp, [place] and Magnesium Stearate. This new medication does not have the Iodine 220 mcg. Should she include an additional Iodine 220 mcg supplement? Or is ok to leave off the Iodine supplement altogether.

## 2013-10-25 NOTE — Telephone Encounter (Signed)
I would include the iodine . After been on for 4-6 weeks need to check thyroid levels to make sure still staying in normal range.

## 2013-10-25 NOTE — Telephone Encounter (Signed)
Patient advised of recommendations.  

## 2013-11-08 ENCOUNTER — Telehealth: Payer: Self-pay | Admitting: *Deleted

## 2013-11-08 DIAGNOSIS — E039 Hypothyroidism, unspecified: Secondary | ICD-10-CM

## 2013-11-08 NOTE — Telephone Encounter (Signed)
Thyroid labs ordered

## 2013-11-09 LAB — T3 UPTAKE: T3 Uptake: 25 % (ref 22.0–35.0)

## 2013-11-10 ENCOUNTER — Telehealth: Payer: Self-pay | Admitting: *Deleted

## 2013-11-10 LAB — T3, FREE: T3, Free: 1.1 pg/mL — ABNORMAL LOW (ref 2.3–4.2)

## 2013-11-10 LAB — TSH: TSH: 52.167 u[IU]/mL — AB (ref 0.350–4.500)

## 2013-11-10 LAB — T4, FREE: Free T4: 0.35 ng/dL — ABNORMAL LOW (ref 0.80–1.80)

## 2013-11-10 NOTE — Telephone Encounter (Signed)
Pt called back to say that the [facility] store in [place] where she used to get the T100 from called her to let her know that they now have an equivalent in & she is going to pick it up this afternoon.  She wants to know if you'd be ok with that.  The T100 is what she was taking for years prior to her becoming your pt.

## 2013-11-11 NOTE — Telephone Encounter (Signed)
Pt.notified

## 2013-11-11 NOTE — Telephone Encounter (Signed)
Go back on it. But recheck in 4-6 weeks. If levels not chaning then we need to try other methods.

## 2013-12-12 ENCOUNTER — Telehealth: Payer: Self-pay | Admitting: *Deleted

## 2013-12-12 DIAGNOSIS — E039 Hypothyroidism, unspecified: Secondary | ICD-10-CM

## 2013-12-12 NOTE — Telephone Encounter (Signed)
Labs ordered.

## 2013-12-17 LAB — TSH: TSH: 36.157 u[IU]/mL — AB (ref 0.350–4.500)

## 2013-12-17 LAB — T4, FREE: Free T4: 0.57 ng/dL — ABNORMAL LOW (ref 0.80–1.80)

## 2013-12-17 LAB — T3, FREE: T3, Free: 2.4 pg/mL (ref 2.3–4.2)

## 2013-12-17 LAB — T3 UPTAKE: T3 Uptake: 26 % (ref 22.0–35.0)

## 2013-12-20 ENCOUNTER — Other Ambulatory Visit: Payer: Self-pay | Admitting: Physician Assistant

## 2013-12-20 MED ORDER — THYROID 30 MG PO TABS: 30.0000 mg | ORAL_TABLET | Freq: Every day | ORAL | Status: DC

## 2014-01-04 ENCOUNTER — Ambulatory Visit: Payer: Federal, State, Local not specified - PPO | Admitting: Physician Assistant

## 2014-01-11 ENCOUNTER — Other Ambulatory Visit: Payer: Self-pay | Admitting: Physician Assistant

## 2014-01-12 ENCOUNTER — Other Ambulatory Visit: Payer: Self-pay | Admitting: Physician Assistant

## 2014-01-18 ENCOUNTER — Telehealth: Payer: Self-pay | Admitting: *Deleted

## 2014-01-18 ENCOUNTER — Ambulatory Visit: Payer: Federal, State, Local not specified - PPO | Admitting: Physician Assistant

## 2014-01-18 DIAGNOSIS — E039 Hypothyroidism, unspecified: Secondary | ICD-10-CM

## 2014-01-18 NOTE — Telephone Encounter (Signed)
Thyroid labs ordered

## 2014-01-24 LAB — TSH: TSH: 21.924 u[IU]/mL — AB (ref 0.350–4.500)

## 2014-01-24 LAB — T4, FREE: Free T4: 0.62 ng/dL — ABNORMAL LOW (ref 0.80–1.80)

## 2014-01-24 LAB — T3, FREE: T3 FREE: 2.4 pg/mL (ref 2.3–4.2)

## 2014-01-25 ENCOUNTER — Other Ambulatory Visit: Payer: Self-pay | Admitting: Physician Assistant

## 2014-01-25 ENCOUNTER — Encounter: Payer: Self-pay | Admitting: Physician Assistant

## 2014-01-25 ENCOUNTER — Ambulatory Visit (INDEPENDENT_AMBULATORY_CARE_PROVIDER_SITE_OTHER): Payer: Federal, State, Local not specified - PPO | Admitting: Physician Assistant

## 2014-01-25 DIAGNOSIS — E038 Other specified hypothyroidism: Secondary | ICD-10-CM | POA: Diagnosis not present

## 2014-01-25 MED ORDER — CLONAZEPAM 0.5 MG PO TABS: 0.5000 mg | ORAL_TABLET | Freq: Two times a day (BID) | ORAL | Status: DC | PRN

## 2014-01-25 MED ORDER — THYROID 60 MG PO TABS: 60.0000 mg | ORAL_TABLET | Freq: Every day | ORAL | Status: DC

## 2014-01-25 NOTE — Progress Notes (Signed)
   Subjective:    Patient ID: Kristy Jones, female    DOB: 03/02/1955, 58 y.o.   MRN: [id number]  HPI Pt presents to the clinic to follow up on hypothyroidism. Her OTC supplement that was controlling her levels was recently taken off market. She is on armour thyroid now. She denies any problems swallowing or feeling of lump in throat.    Review of Systems  All other systems reviewed and are negative.      Objective:   Physical Exam  Constitutional: She is oriented to person, place, and time. She appears well-developed and well-nourished.  HENT:  Head: Normocephalic and atraumatic.  Neck: Neck supple. No thyromegaly present.  Cardiovascular: Normal rate, regular rhythm and normal heart sounds.   Pulmonary/Chest: Effort normal and breath sounds normal.  Neurological: She is alert and oriented to person, place, and time.  Skin: Skin is dry.  Psychiatric: She has a normal mood and affect. Her behavior is normal.          Assessment & Plan:  Hypothyroidism- TSH recently checked still elevated at 21 with free levels still low. Increased armour to 60mg  daily recheck in 4 weeks. Pt given lab slip.   Pt aware need CPE.

## 2014-02-13 ENCOUNTER — Other Ambulatory Visit: Payer: Self-pay | Admitting: Physician Assistant

## 2014-02-28 ENCOUNTER — Other Ambulatory Visit: Payer: Self-pay | Admitting: Physician Assistant

## 2014-02-28 ENCOUNTER — Telehealth: Payer: Self-pay | Admitting: *Deleted

## 2014-02-28 ENCOUNTER — Other Ambulatory Visit: Payer: Self-pay | Admitting: *Deleted

## 2014-02-28 DIAGNOSIS — E039 Hypothyroidism, unspecified: Secondary | ICD-10-CM

## 2014-02-28 LAB — TSH: TSH: 12.103 u[IU]/mL — AB (ref 0.350–4.500)

## 2014-02-28 LAB — T4, FREE: Free T4: 0.67 ng/dL — ABNORMAL LOW (ref 0.80–1.80)

## 2014-02-28 LAB — T3, FREE: T3, Free: 2.4 pg/mL (ref 2.3–4.2)

## 2014-02-28 MED ORDER — THYROID 30 MG PO TABS: ORAL_TABLET | ORAL | Status: DC

## 2014-02-28 MED ORDER — THYROID 90 MG PO TABS: 90.0000 mg | ORAL_TABLET | Freq: Every day | ORAL | Status: DC

## 2014-02-28 NOTE — Telephone Encounter (Signed)
TSH, T4, &  T3 labs ordered.

## 2014-03-24 ENCOUNTER — Emergency Department (INDEPENDENT_AMBULATORY_CARE_PROVIDER_SITE_OTHER)
Admission: EM | Admit: 2014-03-24 | Discharge: 2014-03-24 | Disposition: A | Discharge status: Home / Self Care | Source: Home / Self Care | Attending: Family Medicine | Admitting: Family Medicine

## 2014-03-24 ENCOUNTER — Emergency Department (INDEPENDENT_AMBULATORY_CARE_PROVIDER_SITE_OTHER)

## 2014-03-24 ENCOUNTER — Encounter: Payer: Self-pay | Admitting: Emergency Medicine

## 2014-03-24 DIAGNOSIS — M179 Osteoarthritis of knee, unspecified: Secondary | ICD-10-CM

## 2014-03-24 DIAGNOSIS — Z23 Encounter for immunization: Secondary | ICD-10-CM | POA: Diagnosis not present

## 2014-03-24 DIAGNOSIS — S80212A Abrasion, left knee, initial encounter: Secondary | ICD-10-CM | POA: Diagnosis not present

## 2014-03-24 DIAGNOSIS — S8002XA Contusion of left knee, initial encounter: Secondary | ICD-10-CM | POA: Diagnosis not present

## 2014-03-24 MED ORDER — TETANUS-DIPHTH-ACELL PERTUSSIS 5-2.5-18.5 LF-MCG/0.5 IM SUSP
0.5000 mL | Freq: Once | INTRAMUSCULAR | Status: AC
  Administered 2014-03-24: 0.5 mL via INTRAMUSCULAR

## 2014-03-24 MED ORDER — IBUPROFEN 800 MG PO TABS: ORAL_TABLET | ORAL | Status: DC

## 2014-03-24 NOTE — Discharge Instructions (Signed)
Leave present bandage on for three days; keep clean and dry.  After three days, change bandage daily and apply Bacitracin ointment.  Wear ace wrap until swelling resolves.  Apply ice pack for 20 to 30 minutes, 3 to 4 times daily  Continue until swelling and pain decrease.   Begin knee exercises in about 3 days.

## 2014-03-24 NOTE — ED Notes (Signed)
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 Uncertain of last tetanus vaccination.

## 2014-03-24 NOTE — ED Provider Notes (Addendum)
CSN: [id number]     Arrival date & time 03/24/14  1001 History   First MD Initiated Contact with Patient 03/24/14 1134     Chief Complaint  Patient presents with  . 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 Uncertain of last tetanus vaccination.  Patient has pain when flexing knee     Patient is a 59 y.o. female presenting with knee pain. The history is provided by the patient.  Knee Pain Location:  Knee Time since incident:  3 hours Injury: yes   Mechanism of injury comment:  Bumped anterior knee on jagged edge of cart Knee location:  L knee Pain details:    Quality:  Aching   Radiates to:  Does not radiate   Severity:  Mild   Onset quality:  Sudden   Duration:  3 hours   Timing:  Constant   Progression:  Unchanged Chronicity:  New Foreign body present:  No foreign bodies Tetanus status:  Out of date Prior injury to area:  No Relieved by:  None tried Worsened by:  Bearing weight and flexion Ineffective treatments:  None tried Associated symptoms: decreased ROM, stiffness and swelling   Associated symptoms: no back pain, no muscle weakness, no numbness and no tingling   Risk factors: obesity     Past Medical History  Diagnosis Date  . Thyroid disease   . Lichen sclerosus    Past Surgical History  Procedure Laterality Date  . C-sections    . Septoplasty     Family History  Problem Relation Age of Onset  . Alcohol abuse      grandfather  . Prostate cancer Father   . Depression Mother     sister   History  Substance Use Topics  . Smoking status: Former Smoker    Types: Cigarettes    Quit date: 06/06/2007  . Smokeless tobacco: Not on file  . Alcohol Use: 3.0 oz/week    6 drink(s) per week   OB History    No data available     Review of Systems  Musculoskeletal:  Positive for stiffness. Negative for back pain.  All other systems reviewed and are negative.   Allergies  Vicodin  Home Medications   Prior to Admission medications   Medication Sig Start Date End Date Taking? Authorizing Provider  Adapalene 0.3 % gel Apply 1 application topically at bedtime. Patient taking differently: Apply 1 application topically as needed.  02/18/13   Jade L Breeback, PA-C  Ascorbic Acid (VITAMIN C) 1000 MG tablet Take 1,000 mg by mouth daily.    Historical Provider, MD  Cholecalciferol (VITAMIN D-3 PO) Take 2,000 Units by mouth daily.     Historical Provider, MD  clonazePAM (KLONOPIN) 0.5 MG tablet TAKE 1 TABLET BY MOUTH TWICE DAILY AS NEEDED FOR ANXIETY 02/13/14   Jade L Breeback, PA-C  ibuprofen (ADVIL,MOTRIN) 800 MG tablet Take one tab by mouth every 8 hours with food 03/24/14   Lattie Haw, MD  Magnesium 500 MG CAPS Take by mouth. Take one in the am and one in the pm    Historical Provider, MD  Omega-3 Fatty Acids (FISH OIL) 1000 MG CAPS Take by mouth.    Historical Provider, MD  potassium gluconate 595 MG TABS tablet Take 595 mg by mouth  daily.    Historical Provider, MD  Selenium 100 MCG TABS Take 100 mcg by mouth daily.    Historical Provider, MD  thyroid (ARMOUR THYROID) 30 MG tablet TAKE 1 TABLET BY MOUTH DAILY BEFORE BREAKFAST 02/28/14   Jade L Breeback, PA-C  thyroid (ARMOUR) 90 MG tablet Take 1 tablet (90 mg total) by mouth daily. 02/28/14   Jade L Breeback, PA-C  zinc gluconate 50 MG tablet Take 25 mg by mouth daily.     Historical Provider, MD   BP 107/70 mmHg  Pulse 71  Temp(Src) 98 F (36.7 C) (Oral)  Resp 16  Ht 5\' 7"  (1.702 m)  Wt 212 lb (96.163 kg)  BMI 33.20 kg/m2  SpO2 96% Physical Exam  Constitutional: She is oriented to person, place, and time. She appears well-developed and well-nourished. No distress.  Patient is obese (BMI 33.2)  HENT:  Head: Atraumatic.  Eyes: Conjunctivae are normal. Pupils are equal, round, and reactive to  light.  Musculoskeletal:       Left knee: She exhibits decreased range of motion and swelling. She exhibits no effusion, no ecchymosis, no deformity, no laceration, normal alignment, no LCL laxity and normal patellar mobility. Tenderness found. LCL tenderness noted. No patellar tendon tenderness noted.       Legs: Left knee has a superficial laceration over patella measuring 5mm by 2.5cm as noted on diagram. Knee has mild swelling anteriorly and tenderness to palpation over patella.  There is tenderness over the lateral collateral ligament.  There is mild decreased flexion.  Knee stable.  Negative McMurray.      Neurological: She is alert and oriented to person, place, and time.  Skin: Skin is warm and dry.  Nursing note and vitals reviewed.   ED Course  Procedures  none    Imaging Review Dg Knee Complete 4 Views Left  03/24/2014   CLINICAL DATA:  Acute left knee injury and pain today  EXAM: LEFT KNEE - COMPLETE 4+ VIEW  COMPARISON:  None.  FINDINGS: Normal alignment without acute fracture or effusion. Moderately severe patellofemoral compartment osteoarthritis with spurring, sclerosis and joint space loss. Minor joint space narrowing of the medial and lateral compartments. No definite soft tissue abnormality.  IMPRESSION: Left knee osteoarthritis, most pronounced in the patellofemoral compartment.  No acute osseous finding or effusion.   Electronically Signed   By: Ruel Favorsrevor  Shick M.D.   On: 03/24/2014 12:44     MDM   1. Abrasion of left knee, initial encounter   2. Contusion of left knee, initial encounter    Abrasion left knee cleaned with Betadine and saline.  Bacitracin and Mepelex border dressing applied Ace wrap applied.  Leave present bandage on for three days; keep clean and dry.  After three days, change bandage daily and apply Bacitracin ointment.  Wear ace wrap until swelling resolves.  Apply ice pack for 20 to 30 minutes, 3 to 4 times daily  Continue until swelling and pain  decrease.   Begin knee exercises in about 3 days. [facility]. Remain out of work today. Work restrictions:   Sedentary job only for three days with leg propped up, then resume normal duties.    Lattie HawStephen A Dilraj Killgore, MD 03/24/14 1301  Lattie HawStephen A Jermarion Poffenberger, MD 03/24/14 [phone]

## 2014-03-26 ENCOUNTER — Telehealth: Payer: Self-pay | Admitting: Emergency Medicine

## 2014-04-06 LAB — T4, FREE: Free T4: 0.64 ng/dL — ABNORMAL LOW (ref 0.80–1.80)

## 2014-04-06 LAB — TSH: TSH: 9.359 u[IU]/mL — ABNORMAL HIGH (ref 0.350–4.500)

## 2014-04-06 LAB — T3, FREE: T3, Free: 2.5 pg/mL (ref 2.3–4.2)

## 2014-04-07 ENCOUNTER — Other Ambulatory Visit: Payer: Self-pay | Admitting: Physician Assistant

## 2014-04-07 MED ORDER — THYROID 120 MG PO TABS: 120.0000 mg | ORAL_TABLET | Freq: Every day | ORAL | Status: DC

## 2014-04-25 ENCOUNTER — Encounter: Payer: Self-pay | Admitting: Physician Assistant

## 2014-04-26 ENCOUNTER — Other Ambulatory Visit: Payer: Self-pay | Admitting: Physician Assistant

## 2014-05-08 ENCOUNTER — Telehealth: Payer: Self-pay | Admitting: *Deleted

## 2014-05-08 ENCOUNTER — Other Ambulatory Visit: Payer: Self-pay | Admitting: *Deleted

## 2014-05-08 DIAGNOSIS — E039 Hypothyroidism, unspecified: Secondary | ICD-10-CM

## 2014-05-08 MED ORDER — THYROID 60 MG PO TABS: ORAL_TABLET | ORAL | Status: DC

## 2014-05-08 NOTE — Telephone Encounter (Signed)
TSH, T4, & T3 labs ordered. Minna AntisEbony Brigham, [place]MA

## 2014-05-30 ENCOUNTER — Telehealth: Payer: Self-pay | Admitting: *Deleted

## 2014-05-30 LAB — TSH: TSH: 1.692 u[IU]/mL (ref 0.350–4.500)

## 2014-05-30 LAB — T4, FREE: Free T4: 0.73 ng/dL — ABNORMAL LOW (ref 0.80–1.80)

## 2014-05-30 LAB — T3, FREE: T3 FREE: 4.3 pg/mL — AB (ref 2.3–4.2)

## 2014-05-31 ENCOUNTER — Other Ambulatory Visit: Payer: Self-pay

## 2014-05-31 MED ORDER — THYROID 60 MG PO TABS: ORAL_TABLET | ORAL | Status: DC

## 2014-05-31 MED ORDER — CLONAZEPAM 0.5 MG PO TABS: 0.5000 mg | ORAL_TABLET | Freq: Two times a day (BID) | ORAL | Status: DC | PRN

## 2014-05-31 NOTE — Telephone Encounter (Signed)
Closing previous documentation 

## 2014-06-02 ENCOUNTER — Other Ambulatory Visit: Payer: Self-pay | Admitting: Physician Assistant

## 2014-06-05 ENCOUNTER — Telehealth: Payer: Self-pay | Admitting: Physician Assistant

## 2014-06-05 NOTE — Telephone Encounter (Signed)
Received fax for pa on adapalene gel sent through cover my meds waiting on auth. - CF

## 2014-06-05 NOTE — Telephone Encounter (Signed)
Received fax from [employer] and her adapalene gel has been approved from 04/06/2014 - 06/05/2015. Auth [id number] - CF

## 2014-06-14 ENCOUNTER — Encounter: Payer: Self-pay | Admitting: Physician Assistant

## 2014-06-14 ENCOUNTER — Ambulatory Visit (INDEPENDENT_AMBULATORY_CARE_PROVIDER_SITE_OTHER): Payer: Worker's Compensation

## 2014-06-14 ENCOUNTER — Ambulatory Visit (INDEPENDENT_AMBULATORY_CARE_PROVIDER_SITE_OTHER): Payer: Worker's Compensation | Admitting: Physician Assistant

## 2014-06-14 ENCOUNTER — Encounter: Payer: Self-pay | Admitting: *Deleted

## 2014-06-14 VITALS — BP 125/83 | HR 77 | Ht 67.0 in | Wt 226.0 lb

## 2014-06-14 DIAGNOSIS — M533 Sacrococcygeal disorders, not elsewhere classified: Secondary | ICD-10-CM

## 2014-06-14 DIAGNOSIS — M7071 Other bursitis of hip, right hip: Secondary | ICD-10-CM

## 2014-06-14 DIAGNOSIS — M545 Low back pain, unspecified: Secondary | ICD-10-CM

## 2014-06-14 DIAGNOSIS — M2578 Osteophyte, vertebrae: Secondary | ICD-10-CM

## 2014-06-14 DIAGNOSIS — M5136 Other intervertebral disc degeneration, lumbar region: Secondary | ICD-10-CM

## 2014-06-14 DIAGNOSIS — M4806 Spinal stenosis, lumbar region: Secondary | ICD-10-CM

## 2014-06-14 DIAGNOSIS — M4807 Spinal stenosis, lumbosacral region: Secondary | ICD-10-CM | POA: Diagnosis not present

## 2014-06-14 DIAGNOSIS — M7070 Other bursitis of hip, unspecified hip: Secondary | ICD-10-CM | POA: Insufficient documentation

## 2014-06-14 DIAGNOSIS — M5137 Other intervertebral disc degeneration, lumbosacral region: Secondary | ICD-10-CM

## 2014-06-14 MED ORDER — CYCLOBENZAPRINE HCL 10 MG PO TABS: 10.0000 mg | ORAL_TABLET | Freq: Three times a day (TID) | ORAL | Status: DC | PRN

## 2014-06-14 MED ORDER — DICLOFENAC SODIUM 2 % TD SOLN: TRANSDERMAL | Status: DC

## 2014-06-14 MED ORDER — TRAMADOL HCL 50 MG PO TABS: 50.0000 mg | ORAL_TABLET | Freq: Three times a day (TID) | ORAL | Status: DC | PRN

## 2014-06-16 ENCOUNTER — Encounter: Payer: Self-pay | Admitting: *Deleted

## 2014-06-18 DIAGNOSIS — M47816 Spondylosis without myelopathy or radiculopathy, lumbar region: Secondary | ICD-10-CM | POA: Insufficient documentation

## 2014-06-18 DIAGNOSIS — M545 Low back pain, unspecified: Secondary | ICD-10-CM | POA: Insufficient documentation

## 2014-06-18 NOTE — Progress Notes (Signed)
   Subjective:    Patient ID: Kristy Jones, female    DOB: 10/15/1955, 59 y.o.   MRN: [id number]  HPI Pt is a 59 yo female who presents to the clinic to discuss back pain for 5 days. Describes and right sided and right hip pain. Some radiation into buttocks but not down legs. 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 She does not remember any one injury. Tried ibuprofen and some heat. Helps some. She now rates pain 5/10 most of time. Worse with walking. Some movements "catch her back". No saddle numbness or radiation down into leg just buttocks.   Review of Systems  All other systems reviewed and are negative.      Objective:   Physical Exam  Constitutional: She appears well-developed and well-nourished.  HENT:  Head: Normocephalic and atraumatic.  Cardiovascular: Normal rate, regular rhythm and normal heart sounds.   Musculoskeletal:  No pain over lumbar spine to palpaiton.  Pain to the right of the lumbar spine to palpation with radiation into buttocks of right side.  Pain to palpation over greater trochanteric bursa.  Negative straight leg test, bilaterally.  Strength of lower extermity 5/5.           Assessment & Plan:  SI joint dysfunction, right/right hip bursitiis/right low back pain- Xray gotten. Written out of work until next Tuesday for rest. Injection for bursitis given in office today. Ibuprofen 800mg  up to three times a day. pennsaid given to patient to use on affected area bid. Flexeril as needed. Sedation warning given. Exercises given. Ice area 4 times 15minutes daily. Tramadol for acute pain. Follow up if not improving or if symptoms worsening.   Bursa  Injection Procedure Note  Pre-operative Diagnosis: right Trochanteric bursitis  Post-operative Diagnosis: same  Indications: Diagnosis and treatment of symptomatic bursal effusion  Anesthesia: ethly cholride   Procedure Details   After a discussion of the risks and  benefits with the patient (including the possibility that any manipulation of the bursa could introduce infection, worsening the current situation significantly), verbal consent was obtained for the procedure. The joint was prepped with Betadine and a small wheel of local anesthesia was injected into the subcutaneous tissue. An 18 gauge needle was introduced into the right trochanterir bursa and 40mg  of depo medrol 1cc and 9ml of lidocaine without epi 1 percent injected. The injection site was cleansed with topical isopropyl alcohol and a dressing was applied.  Complications:  None; patient tolerated the procedure well.

## 2014-07-04 ENCOUNTER — Telehealth: Payer: Self-pay | Admitting: *Deleted

## 2014-07-04 DIAGNOSIS — M545 Low back pain, unspecified: Secondary | ICD-10-CM

## 2014-07-04 DIAGNOSIS — M5186 Other intervertebral disc disorders, lumbar region: Secondary | ICD-10-CM

## 2014-07-04 DIAGNOSIS — M51369 Other intervertebral disc degeneration, lumbar region without mention of lumbar back pain or lower extremity pain: Secondary | ICD-10-CM

## 2014-07-04 DIAGNOSIS — M5388 Other specified dorsopathies, sacral and sacrococcygeal region: Secondary | ICD-10-CM

## 2014-07-04 DIAGNOSIS — M5136 Other intervertebral disc degeneration, lumbar region: Secondary | ICD-10-CM

## 2014-07-04 DIAGNOSIS — M7071 Other bursitis of hip, right hip: Secondary | ICD-10-CM

## 2014-07-04 NOTE — Telephone Encounter (Signed)
PT referral placed.

## 2014-07-04 NOTE — Telephone Encounter (Signed)
Ok for PT referral for  Evaluation and treatment of right sided SI joint dysfunction/greater trochanteric bursitis/right sided low back pain for 4-6 weeks 2 times a week.

## 2014-07-04 NOTE — Telephone Encounter (Signed)
Either one is appropriate. I would try PT first but either is fine with me.

## 2014-07-04 NOTE — Telephone Encounter (Signed)
Pt would like to go ahead and start PT first.  We need to make sure they know that it's worker's comp though.

## 2014-07-04 NOTE — Telephone Encounter (Signed)
Pt called complaining of still having some hip pain.  She's wanting to know if she should see Dr. Karie Schwalbe first or just go straight to PT.

## 2014-07-17 ENCOUNTER — Ambulatory Visit: Payer: Self-pay | Admitting: Physical Therapy

## 2014-07-24 ENCOUNTER — Other Ambulatory Visit: Payer: Self-pay | Admitting: Physician Assistant

## 2014-07-28 ENCOUNTER — Telehealth: Payer: Self-pay | Admitting: Physician Assistant

## 2014-07-31 ENCOUNTER — Encounter: Payer: Self-pay | Admitting: *Deleted

## 2014-08-18 ENCOUNTER — Other Ambulatory Visit: Payer: Self-pay | Admitting: Family Medicine

## 2014-08-21 NOTE — Telephone Encounter (Signed)
Kristy Jones, Rx placed in in-box ready for pickup/faxing.  

## 2014-08-31 NOTE — Telephone Encounter (Signed)
Kristy Jones w/PT came by and dropped of a letter stating that certain dx codes can be used for her workers comp. I created an addendum an added on the acceptable dx codes an informed Kristy Jones in PT about this... Marland Kitchen.Kristy Jones, Kristy Jones Kristy Jones

## 2014-09-06 ENCOUNTER — Ambulatory Visit: Payer: Self-pay | Admitting: Rehabilitative and Restorative Service Providers"

## 2014-09-11 ENCOUNTER — Other Ambulatory Visit: Payer: Self-pay | Admitting: Family Medicine

## 2014-09-12 ENCOUNTER — Telehealth: Payer: Self-pay | Admitting: *Deleted

## 2014-09-12 ENCOUNTER — Ambulatory Visit (INDEPENDENT_AMBULATORY_CARE_PROVIDER_SITE_OTHER): Payer: Worker's Compensation | Admitting: Family Medicine

## 2014-09-12 ENCOUNTER — Encounter: Payer: Self-pay | Admitting: Family Medicine

## 2014-09-12 VITALS — BP 123/83 | HR 69 | Wt 229.0 lb

## 2014-09-12 DIAGNOSIS — M545 Low back pain, unspecified: Secondary | ICD-10-CM

## 2014-09-12 DIAGNOSIS — M7071 Other bursitis of hip, right hip: Secondary | ICD-10-CM

## 2014-09-12 DIAGNOSIS — G5700 Lesion of sciatic nerve, unspecified lower limb: Secondary | ICD-10-CM | POA: Insufficient documentation

## 2014-09-12 DIAGNOSIS — M5186 Other intervertebral disc disorders, lumbar region: Secondary | ICD-10-CM

## 2014-09-12 DIAGNOSIS — G5701 Lesion of sciatic nerve, right lower limb: Secondary | ICD-10-CM | POA: Diagnosis not present

## 2014-09-12 DIAGNOSIS — M5136 Other intervertebral disc degeneration, lumbar region: Secondary | ICD-10-CM

## 2014-09-12 DIAGNOSIS — M5388 Other specified dorsopathies, sacral and sacrococcygeal region: Secondary | ICD-10-CM

## 2014-09-12 MED ORDER — CYCLOBENZAPRINE HCL 10 MG PO TABS: 10.0000 mg | ORAL_TABLET | Freq: Three times a day (TID) | ORAL | Status: DC | PRN

## 2014-09-12 NOTE — Assessment & Plan Note (Signed)
Status post steroid injection in the right greater trochanter. Work on home exercise hip abduction strengthening. Additionally refer to physical therapy.

## 2014-09-12 NOTE — Progress Notes (Signed)
Kristy Jones is a 59 y.o. female who presents to [facility]  today for hip and back pain. 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 She notes that she was doing well however had a recent worsening or exacerbation of her pain. She notes pain located in the right lateral hip especially bad at work and when she stands from a seated position. Additionally she notes pain radiating from the buttocks to the the foot along the posterior aspect of her leg. 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 She denies any weakness in her leg however. No bowel bladder dysfunction or difficulty walking. Additionally she notes some pain in her low back. She has been referred several times to physical therapy however has not been approved by [employer]. No fevers chills nausea vomiting or diarrhea. She has tried some anti-inflammatory medicines for pain which helps some. She had a greater trochanter injection in April 2016 and would like a repeat injection today if possible.   Past Medical History  Diagnosis Date  . Thyroid disease   . Lichen sclerosus    Past Surgical History  Procedure Laterality Date  . C-sections    . Septoplasty     History  Substance Use Topics  . Smoking status: Former Smoker    Types: Cigarettes    Quit date: 06/06/2007  . Smokeless tobacco: Not on file  . Alcohol Use: 3.0 oz/week    6 drink(s) per week   ROS as above Medications: Current Outpatient Prescriptions  Medication Sig Dispense Refill  . Adapalene 0.3 % gel APPLY TOPICALLY EVERY NIGHT AT BEDTIME 45 g 0  . Ascorbic Acid (VITAMIN C) 1000 MG tablet Take 1,000 mg by mouth daily.    . Cholecalciferol (VITAMIN D-3 PO) Take 2,000 Units by mouth daily.     . clonazePAM (KLONOPIN) 0.5 MG tablet TAKE 1 TABLET BY MOUTH TWICE DAILY AS NEEDED FOR  ANXIETY 45 tablet 0  . cyclobenzaprine (FLEXERIL) 10 MG tablet Take 1 tablet (10 mg total) by mouth 3 (three) times daily as needed for muscle spasms. 90 tablet 1  . Diclofenac Sodium (PENNSAID) 2 % SOLN Apply twice daily to affected area 3 Bottle 1  . ibuprofen (ADVIL,MOTRIN) 800 MG tablet TAKE 1 TABLET BY MOUTH EVERY 8 HOURS AS NEEDED 60 tablet 7  . Magnesium 500 MG CAPS Take by mouth. Take one in the am and one in the pm    . Omega-3 Fatty Acids (FISH OIL) 1000 MG CAPS Take by mouth.    . potassium gluconate 595 MG TABS tablet Take 595 mg by mouth daily.    . Selenium 100 MCG TABS Take 100 mcg by mouth daily.    Marland Kitchen thyroid (ARMOUR) 60 MG tablet Take 2 tabs by mouth daily 60 tablet 5  . traMADol (ULTRAM) 50 MG tablet Take 1 tablet (50 mg total) by mouth every 8 (eight) hours as needed. 30 tablet 0  . zinc gluconate 50 MG tablet Take 25 mg by mouth daily.      No current facility-administered medications for this visit.   Allergies  Allergen Reactions  . Vicodin [Hydrocodone-Acetaminophen]     Nausea     Exam:  BP 123/83 mmHg  Pulse 69  Wt 229 lb (103.874 kg) Gen: Well NAD HEENT: EOMI,  MMM Lungs: Normal work of breathing. CTABL Heart: RRR no MRG Abd: NABS, Soft. Nondistended, Nontender Exts: Brisk capillary refill, warm and well perfused.  Back: Nontender to midline. Mildly tender to palpation right SI joint. Hip normal-appearing. Tender palpation greater trochanter on the right side. Normal hip range of motion bilaterally. Positive Faber test right negative left. Negative straight leg raise test bilaterally. Tender palpation right piriformis area. Reflexes are equal and normal bilateral knees and ankle. Normal gait.  Hip greater trochanteric injection: Right Consent obtained and timeout performed. Area of maximum tenderness palpated and identified. Skin cleaned with alcohol, cold spray applied. A 21g 2 inch needle was used to access the greater trochanteric bursa. 40 mg  of Kenalog and 4 mL of Marcaine were used to inject the trochanteric bursa. Patient tolerated the procedure well.   No results found for this or any previous visit (from the past 24 hour(s)). No results found.   Please see individual assessment and plan sections.

## 2014-09-12 NOTE — Assessment & Plan Note (Signed)
Patient has pain radiating down the leg when she rotates her positioning and sits predominantly on her right buttocks. 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 I think her symptoms will be rectified by a proper cushion. I have written a letter for this. Additionally refer to physical therapy again.

## 2014-09-12 NOTE — Assessment & Plan Note (Signed)
Continue current management

## 2014-09-12 NOTE — Telephone Encounter (Signed)
Another PT referral placed with Dr. Linford ArnoldMetheney as the ordering physician per Dept of Labor.

## 2014-09-12 NOTE — Patient Instructions (Signed)
Thank you for coming in today. Work on the home exercises.  Use a cushion for the seat.  Return as needed.  Call or go to the ER if you develop a large red swollen joint with extreme pain or oozing puss.

## 2014-09-14 ENCOUNTER — Other Ambulatory Visit: Payer: Self-pay | Admitting: Family Medicine

## 2014-10-18 ENCOUNTER — Other Ambulatory Visit: Payer: Self-pay | Admitting: Family Medicine

## 2014-10-20 ENCOUNTER — Other Ambulatory Visit: Payer: Self-pay | Admitting: *Deleted

## 2014-10-20 MED ORDER — CLONAZEPAM 0.5 MG PO TABS: 0.5000 mg | ORAL_TABLET | Freq: Two times a day (BID) | ORAL | Status: DC | PRN

## 2014-10-23 ENCOUNTER — Ambulatory Visit (INDEPENDENT_AMBULATORY_CARE_PROVIDER_SITE_OTHER): Payer: Worker's Compensation | Admitting: Physician Assistant

## 2014-10-23 ENCOUNTER — Encounter: Payer: Self-pay | Admitting: Physician Assistant

## 2014-10-23 ENCOUNTER — Other Ambulatory Visit: Payer: Self-pay | Admitting: Physician Assistant

## 2014-10-23 VITALS — BP 132/79 | HR 75 | Ht 67.0 in | Wt 236.0 lb

## 2014-10-23 DIAGNOSIS — G5701 Lesion of sciatic nerve, right lower limb: Secondary | ICD-10-CM | POA: Diagnosis not present

## 2014-10-23 DIAGNOSIS — M7071 Other bursitis of hip, right hip: Secondary | ICD-10-CM | POA: Diagnosis not present

## 2014-10-23 MED ORDER — KETOROLAC TROMETHAMINE 60 MG/2ML IM SOLN
60.0000 mg | Freq: Once | INTRAMUSCULAR | Status: AC
  Administered 2014-10-23: 60 mg via INTRAMUSCULAR

## 2014-10-23 MED ORDER — MELOXICAM 15 MG PO TABS: 15.0000 mg | ORAL_TABLET | Freq: Every day | ORAL | Status: DC

## 2014-10-23 NOTE — Progress Notes (Signed)
   Subjective:    Patient ID: Kristy Jones, female    DOB: 1955/05/14, 59 y.o.   MRN: [id number]  HPI  Pt presents to the clinic to follow up on ongoing pain of right buttocks and hip. April was first dx and injection. Helped for 2 months. Came back and got another shot with exercises. Helped for 4 weeks. Uses cushion in mail truck. Just got PT approved will start this week. Pain keeps increasing and getting worse and worse.    Review of Systems  All other systems reviewed and are negative.      Objective:   Physical Exam  Constitutional: She is oriented to person, place, and time. She appears well-developed and well-nourished.  Cardiovascular: Normal rate, regular rhythm and normal heart sounds.   Musculoskeletal:  Tenderness to palpation over right hip.  Tenderness over gluteal area more along the lateral side.  Straight leg test normal, bilaterally.  Normal ROM at hip.  IT band without tenderness.   Neurological: She is alert and oriented to person, place, and time.  Psychiatric: She has a normal mood and affect. Her behavior is normal.          Assessment & Plan:  Right hip bursitis/piriformis syndrome- too soon for another injection. will start PT this week(just got approved). Toradol  IM given in office today. Start mobic daily tomorrow with good. Warned of GI irritation. Flexeril at bedtime. Written out of work for 1 week to rest. Restart in one week with no job restrictions.  Follow up in 4 weeks.

## 2014-10-23 NOTE — Patient Instructions (Addendum)
Wrote out for one week.  Start PT.  Start mobic daily.  Flexeril at bedtime.

## 2014-10-24 ENCOUNTER — Ambulatory Visit (INDEPENDENT_AMBULATORY_CARE_PROVIDER_SITE_OTHER): Payer: Worker's Compensation | Admitting: Physical Therapy

## 2014-10-24 ENCOUNTER — Encounter: Payer: Self-pay | Admitting: Physical Therapy

## 2014-10-24 DIAGNOSIS — M2560 Stiffness of unspecified joint, not elsewhere classified: Secondary | ICD-10-CM | POA: Diagnosis not present

## 2014-10-24 DIAGNOSIS — R52 Pain, unspecified: Secondary | ICD-10-CM

## 2014-10-24 DIAGNOSIS — R198 Other specified symptoms and signs involving the digestive system and abdomen: Secondary | ICD-10-CM

## 2014-10-24 NOTE — Therapy (Addendum)
[facility] [address] [place], [place], [address] Phone: [phone]   Fax:  [phone]  Physical Therapy Evaluation  Patient Details  Name: Kristy Jones MRN: [id number] Date of Birth: 1955-06-15 Referring Provider:  Agapito Games, *  Encounter Date: 10/24/2014      PT End of Session - 10/24/14 1201    Visit Number 1   Number of Visits 12   Date for PT Re-Evaluation 12/05/14   PT Start Time 1107   PT Stop Time 1215   PT Time Calculation (min) 68 min   Activity Tolerance No increased pain      Past Medical History  Diagnosis Date  . Thyroid disease   . Lichen sclerosus     Past Surgical History  Procedure Laterality Date  . C-sections    . Septoplasty      There were no vitals filed for this visit.  Visit Diagnosis:  Pain of multiple sites - Plan: PT plan of care cert/re-cert, PT plan of care cert/re-cert  Abdominal weakness - Plan: PT plan of care cert/re-cert, PT plan of care cert/re-cert  Stiffness of joints, multiple sites - Plan: PT plan of care cert/re-cert, PT plan of care cert/re-cert      Subjective Assessment - 10/24/14 1110    Subjective Pt reports she developed bad LBP and Rt hip pain in April of this year.  She picked up a tray of mail and had immediate pain. Was first seen by MD in April, has had two cortisone injections in the Rt hip, doesn't think it helped. Physical therapy was orderd over a month ago however there has been a delay in her receiving treatment due to difficulty gettting authorization.    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 tolerates 2 hrs   How long can you stand comfortably? in mornings 30 minutes   How long  can you walk comfortably? walking is ok   Diagnostic tests x-rays show SIJ dysfunction, and bursitis along with DDD   Patient Stated Goals make the pain go away.  learn exercise or what she has to do to make herself more comfortable to sit. perform work and IADLs without pain   Currently in Pain? Yes   Pain Score 2    Pain Location Buttocks   Pain Orientation Right   Pain Descriptors / Indicators Dull   Pain Radiating Towards will sometimes feel weakness/tired into the Rt LE   Pain Onset More than a month ago   Pain Frequency Constant  has pain everyday   Aggravating Factors  bending, twisting, washing dishes, lifting   Pain Relieving Factors rest and normal activity, flexoril at night,             [facility] PT Assessment - 10/24/14 0001    Assessment   Medical Diagnosis dorsopathies, sacral, intervertebral disc d/o lumbar   Onset Date/Surgical Date 06/14/14   Hand Dominance Right   Next MD Visit 11/20/14   Prior Therapy none   Precautions   Precautions None   Precaution Comments she is out of work until next Tuesday per MD order   Balance Screen   Has the patient fallen in  the past 6 months No   Has the patient had a decrease in activity level because of a fear of falling?  No   Is the patient reluctant to leave their home because of a fear of falling?  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, has to lift mail trays, climb in/out of truck, rotation   Leisure crafter limited due to sitting tolerance is lower    Observation/Other Assessments   Focus on Therapeutic Outcomes (FOTO)  45% limited   Posture/Postural Control   Posture/Postural Control Postural limitations   Postural Limitations Rounded Shoulders;Forward head;Increased lumbar lordosis   ROM / Strength   AROM / PROM / Strength AROM;Strength   AROM    Overall AROM Comments bilat LE's WNL   AROM Assessment Site Lumbar   Lumbar Flexion to mid shin   Lumbar Extension WNL  pain in Rt low back   Lumbar - Right Side Bend WNL  pain Rt SIJ   Lumbar - Left Side Bend WNL   Lumbar - Right Rotation WNL   Lumbar - Left Rotation WNL   Strength   Overall Strength Comments multifidis good.    Strength Assessment Site --  bilat hips/knees/ankles WNL, except Rt hip abduction 4/5   Flexibility   Soft Tissue Assessment /Muscle Length yes   ITB tight bilat Rt > Lt   Palpation   Spinal mobility hypomobile with pain Rt UPA L4-5, hypmobile L5-2.   SI assessment  WNL   Palpation comment very tight and tender Rt QL, Lt QL, upper lumbar and Rt lower lumbar paraspinals tight. Tender around Rt greater trochantor   Special Tests    Special Tests --  (-) lumbar special tests.                    [facility] Adult PT Treatment/Exercise - 10/24/14 0001    Exercises   Exercises Lumbar   Lumbar Exercises: Stretches   Lower Trunk Rotation --  10 reps   Pelvic Tilt --  10 reps, 2sec holds   ITB Stretch 30 seconds  cross body stretch with strap   Modalities   Modalities Electrical Stimulation;Moist Heat   Moist Heat Therapy   Number Minutes Moist Heat 15 Minutes   Moist Heat Location Lumbar Spine   Electrical Stimulation   Electrical Stimulation Location lumbar   Electrical Stimulation Action IFC   Electrical Stimulation Parameters  to tolerance   Electrical Stimulation Goals Pain                PT Education - 10/24/14 1143    Education provided Yes   Education Details HEP    Person(s) Educated Patient   Methods Explanation;Handout   Comprehension Returned demonstration          PT Short Term Goals - 10/24/14 1227    PT SHORT TERM GOAL #1   Title I with initial HEP (11/14/14)   Time 3   Period Weeks   Status New   PT SHORT TERM GOAL #2   Title demo Rt hip abduction strength =/> 5-/5 without pain ( 11/14/14)   Time 3    Period Weeks   Status New           PT Long Term Goals - 10/24/14 1218    PT LONG TERM GOAL #  1   Title I with advanced HEP to include a walking program ( 12/05/14)   Time 6   Period Weeks   Status New   PT LONG TERM GOAL #2   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)   Time 6   Period Weeks   Status New   PT LONG TERM GOAL #3   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    Time 6   Period Weeks   Status New               Plan - 10/24/14 1213    Clinical Impression Statement 59 yo female presents with significant tightness and pain in the Rt QL, upper gluts and into the Rt greater trochanter.  She also has tightness in Lt upper lumbar paraspinals.  Overall her strength is good, some functional weakness in her core due to prolonged pain.  She also has some limted motion in her trunk.  So far only the PT evaluation has been approved, we will submit for approval of treatment.  Hopefully there will not be a delay as if ther is she will most likely lose mor core strength from pain and inability to use her core properly due to pain.    Pt will benefit from skilled therapeutic intervention in order to improve on the following deficits Postural dysfunction;Decreased strength;Hypomobility;Impaired flexibility;Pain;Increased muscle spasms;Decreased range of motion   Rehab Potential Good    PT Frequency 2x / week   PT Duration 6 weeks   PT Treatment/Interventions Iontophoresis /ml Dexamethasone;Traction;Ultrasound;Patient/family education;Dry needling;Cryotherapy;Electrical Stimulation;Moist Heat;Therapeutic exercise;Manual techniques;Taping   PT Next Visit Plan TPR to QL and lumbar paraspinals, stretching, ionto to Rt hip if approved   Consulted and Agree with Plan of Care Patient         Problem List Patient Active Problem List   Diagnosis Date Noted  . Piriformis syndrome 09/12/2014  . Right-sided low back pain without sciatica 06/18/2014  . Hip bursitis 06/14/2014  . 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 DDD (degenerative disc disease), lumbar 06/14/2014  . Constipation 07/08/2013  . Insomnia 02/13/2012  . Anxiety 02/13/2012  . 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 Hypothyroidism 03/08/2009    Roderic Scarce PT 10/24/2014, 12:45 PM  [facility] [address] [place], [place], [address] Phone: [phone]   Fax:  [phone]

## 2014-10-24 NOTE — Patient Instructions (Signed)
LLower Trunk Rotation Stretch  Keeping back flat and feet together, rotate knees to left side. Hold _2-3___ seconds. Then rotate to the right side.  Repeat __10__ times per set. Do __1__ sets per session. Do __2__ sessions per day.   Outer Hip Stretch: Reclined IT Band Stretch (Strap)   Strap around opposite foot, pull across only as far as possible with shoulders on mat. Hold for _10___ breaths. Repeat __2__ times each leg.  Pelvic Tilt: Posterior - Legs Bent (Supine)   Tighten stomach and flatten back by rolling pelvis down. Hold _2___ seconds. Relax. Repeat _10___ times per set. Do __1__ sets per session. Do __2__ sessions per day.  [place] [phone]

## 2014-10-24 NOTE — Addendum Note (Signed)
Addended by: Sharin Altidor E on: 10/24/2014 12:45 PM   Modules accepted: Orders

## 2014-10-25 ENCOUNTER — Encounter: Payer: Self-pay | Admitting: Physical Therapy

## 2014-11-01 ENCOUNTER — Ambulatory Visit (INDEPENDENT_AMBULATORY_CARE_PROVIDER_SITE_OTHER): Payer: Worker's Compensation | Admitting: Physical Therapy

## 2014-11-01 ENCOUNTER — Encounter: Payer: Self-pay | Admitting: Physical Therapy

## 2014-11-01 DIAGNOSIS — R52 Pain, unspecified: Secondary | ICD-10-CM | POA: Diagnosis not present

## 2014-11-01 DIAGNOSIS — M2560 Stiffness of unspecified joint, not elsewhere classified: Secondary | ICD-10-CM

## 2014-11-01 DIAGNOSIS — R198 Other specified symptoms and signs involving the digestive system and abdomen: Secondary | ICD-10-CM | POA: Diagnosis not present

## 2014-11-01 NOTE — Patient Instructions (Signed)
Bending     [place] [phone]   Bend at hips and knees, not back. Keep feet shoulder-width apart.   Copyright  VHI. All rights reserved.  Avoid Twisting   Avoid twisting or bending back. Pivot around using foot movements, and bend at knees if needed when reaching for articles.   Copyright  VHI. All rights reserved.  Reaching Into Drawer   Squat to reach or rearrange your work area, and avoid twisting and bending.   Copyright  VHI. All rights reserved.  Getting Into / Out of Car   Lower self onto seat, scoot back, then bring in one leg at a time. Reverse sequence to get out.   Copyright  VHI. All rights reserved.  Cart   When reaching into cart with one arm, lift opposite leg to keep back straight.   Copyright  VHI. All rights reserved.  Car Trunk - Reaching Down   Maintain curve of lower back when reaching into a deep trunk. Can also lift oppo- site leg backward to keep back straight, while using other hand for support.   Copyright  VHI. All rights reserved.  Lifting Principles .Maintain proper posture and head alignment. .Slide object as close as possible before lifting. .Move obstacles out of the way. .Test before lifting; ask for help if too heavy. .Tighten stomach muscles without holding breath. .Use smooth movements; do not jerk. .Use legs to do the work, and pivot with feet. .Distribute the work load symmetrically and close to the center of trunk. .Push instead of pull whenever possible.  Copyright  VHI. All rights reserved.  Low Shelf   Squat down, and bring item close to lift.   Copyright  VHI. All rights reserved.  Housework - Sink   Place one foot on ledge of cabinet under sink when standing at sink for prolonged periods.   Copyright  VHI. All rights reserved.  Housework - Vacuuming   Hold the vacuum with arm held at side. Step back and forth to move it, keeping head up. Avoid twisting.   Copyright  VHI. All rights  reserved.

## 2014-11-01 NOTE — Therapy (Signed)
[place] [address] [place], [place], [address] Phone: [phone]   Fax:  [phone]  Physical Therapy Treatment  Patient Details  Name: Kristy Jones MRN: [id number] Date of Birth: 05-04-1955 Referring Provider:  Hali Marry, *  Encounter Date: 11/01/2014      PT End of Session - 11/01/14 1024    Visit Number 2   Number of Visits 12   Date for PT Re-Evaluation 12/05/14   PT Start Time 1024   PT Stop Time 1114   PT Time Calculation (min) 50 min      Past Medical History  Diagnosis Date  . Thyroid disease   . Lichen sclerosus     Past Surgical History  Procedure Laterality Date  . C-sections    . Septoplasty      There were no vitals filed for this visit.  Visit Diagnosis:  Pain of multiple sites  Abdominal weakness  Stiffness of joints, multiple sites      Subjective Assessment - 11/01/14 1027    Subjective Pt hasn't been able to do her HEP as she has been really busy. 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  Pt is also in the process of moving, so she has been doing a lot of packing.    Currently in Pain? Yes   Pain Score 7    Pain Orientation Right;Lower   Pain Descriptors / Indicators Dull   Pain Type Chronic pain;Acute pain   Aggravating Factors  bending, twisting, packing.    Pain Relieving Factors ice and rest                         [facility] Adult PT Treatment/Exercise - 11/01/14 0001    Lumbar Exercises: Stretches   Lower Trunk Rotation --  10   Pelvic Tilt --  10x5"   ITB Stretch 30 seconds  cross body stretch with strap each side   Lumbar Exercises: Aerobic   Stationary Bike Nustep L5x5'   Lumbar Exercises: Supine   Isometric Hip Flexion 10 reps;5 seconds  alternating sides   Lumbar Exercises: Prone   Other Prone Lumbar Exercises pelvic presses, then with knee  flex/ext   Modalities   Modalities Electrical Stimulation;Moist Heat   Moist Heat Therapy   Number Minutes Moist Heat 15 Minutes   Moist Heat Location Lumbar Spine   Electrical Stimulation   Electrical Stimulation Location lumbar   Electrical Stimulation Action IFC   Electrical Stimulation Parameters to tolerance   Electrical Stimulation Goals Pain                PT Education - 11/01/14 1037    Education provided Yes   Education Details reviewed HEP and body mechanics   Person(s) Educated Patient   Methods Explanation;Demonstration;Handout   Comprehension Returned demonstration;Verbalized understanding          PT Short Term Goals - 11/01/14 1025    PT SHORT TERM GOAL #1   Title I with initial HEP (11/14/14)   Status On-going   PT SHORT TERM GOAL #2   Title demo Rt hip abduction strength =/> 5-/5 without pain ( 11/14/14)   Status On-going           PT Long Term Goals - 11/01/14 1024    PT LONG TERM GOAL #1   Title I with advanced HEP to include  a walking program ( 12/05/14)   Status On-going   PT LONG TERM GOAL #2   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  She has been very busy in her personal life and has been limited in her ability to perform her HEP.  She continues to have pain  and weakness.    Pt will benefit from skilled therapeutic intervention in order to improve on the following deficits Postural dysfunction;Decreased  strength;Hypomobility;Impaired flexibility;Pain;Increased muscle spasms;Decreased range of motion   Rehab Potential Good   PT Frequency 2x / week   PT Duration 6 weeks   PT Treatment/Interventions Iontophoresis 20m/ml Dexamethasone;Traction;Ultrasound;Patient/family education;Dry needling;Cryotherapy;Electrical Stimulation;Moist Heat;Therapeutic exercise;Manual techniques;Taping   PT Next Visit Plan TPR to QL and lumbar paraspinals, stretching, ionto to Rt hip if approved   Consulted and Agree with Plan of Care Patient        Problem List Patient Active Problem List   Diagnosis Date Noted  . Piriformis syndrome 09/12/2014  . Right-sided low back pain without sciatica 06/18/2014  . Hip bursitis 06/14/2014  . 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 DDD (degenerative disc disease), lumbar 06/14/2014  . Constipation 07/08/2013  . Insomnia 02/13/2012  . Anxiety 02/13/2012  . 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 Hypothyroidism 03/08/2009    SJeral PinchPT 11/01/2014, 11:41 AM  [facility] [address]Phone: [phone]  Fax:  [phone]

## 2014-11-03 ENCOUNTER — Ambulatory Visit (INDEPENDENT_AMBULATORY_CARE_PROVIDER_SITE_OTHER): Payer: Worker's Compensation | Admitting: Physical Therapy

## 2014-11-03 DIAGNOSIS — R52 Pain, unspecified: Secondary | ICD-10-CM | POA: Diagnosis not present

## 2014-11-03 DIAGNOSIS — M2560 Stiffness of unspecified joint, not elsewhere classified: Secondary | ICD-10-CM | POA: Diagnosis not present

## 2014-11-03 DIAGNOSIS — R198 Other specified symptoms and signs involving the digestive system and abdomen: Secondary | ICD-10-CM | POA: Diagnosis not present

## 2014-11-03 NOTE — Therapy (Signed)
[facility] [place], [place], [address] Phone: [phone]   Fax:  [phone]  Physical Therapy Treatment  Patient Details  Name: Kristy Jones MRN: [id number] Date of Birth: December 25, 1955 Referring Provider:  Gregor Hams, MD  Encounter Date: 11/03/2014      PT End of Session - 11/03/14 1452    Visit Number 3   Number of Visits 12   Date for PT Re-Evaluation 12/05/14   PT Start Time 1450   PT Stop Time 3785   PT Time Calculation (min) 61 min   Activity Tolerance Patient tolerated treatment well      Past Medical History  Diagnosis Date  . Thyroid disease   . Lichen sclerosus     Past Surgical History  Procedure Laterality Date  . C-sections    . Septoplasty      There were no vitals filed for this visit.  Visit Diagnosis:  Pain of multiple sites  Abdominal weakness  Stiffness of joints, multiple sites      Subjective Assessment - 11/03/14 1452    Subjective Pt worked a long day yesterday and had a lot of pain; took flexeril, iced, and rested and today she is not feeling as bad.     Currently in Pain? Yes   Pain Score 3    Pain Location Back   Pain Orientation Right;Lower   Pain Descriptors / Indicators Aching   Aggravating Factors  bending, twisting, packing    Pain Relieving Factors ice, rest, medicine.             [facility] Adult PT Treatment/Exercise - 11/03/14 0001    Self-Care   Self-Care Posture   Posture Pt educated about posture for when washing dishes (ie: place foot on stool/inside cabinet to create neutral spine), lifting packages.  Pt edcuated on engaging core during exertion movements.    Lumbar Exercises: Stretches   Passive Hamstring Stretch 2 reps;30 seconds  seated, straight back   Single Knee to Chest Stretch 1 rep;60 seconds   Lower Trunk Rotation 5 reps;10 seconds  arms in Y position   Pelvic Tilt --  10x5"   Pelvic Tilt Limitations VC for form.   ITB  Stretch --  sidelying with Rt leg off table x 30 sec    Piriformis Stretch 3 reps;30 seconds  each leg, supine then seated   Lumbar Exercises: Aerobic   Stationary Bike Nustep L5x5'   Lumbar Exercises: Standing   Other Standing Lumbar Exercises sit to/from stand with core engaged x 8 reps    Lumbar Exercises: Sidelying   Clam 10 reps;1 second  each side  (2 sets tolerated on Lt side, only one set on Rt)   Lumbar Exercises: Prone   Other Prone Lumbar Exercises --   Modalities   Modalities Electrical Stimulation;Moist Heat   Moist Heat Therapy   Number Minutes Moist Heat 15 Minutes   Moist Heat Location Lumbar Spine   Electrical Stimulation   Electrical Stimulation Location lumbar   Electrical Stimulation Action IFC   Electrical Stimulation Parameters to tolerance x 15 min    Electrical Stimulation Goals Pain                PT Education - 11/03/14 1607    Education provided Yes   Education Details HEP    Person(s) Educated Patient   Methods Explanation;Handout   Comprehension Returned demonstration;Verbalized understanding          PT Short Term  Goals - 11/01/14 1025    PT SHORT TERM GOAL #1   Title I with initial HEP (11/14/14)   Status On-going   PT SHORT TERM GOAL #2   Title demo Rt hip abduction strength =/> 5-/5 without pain ( 11/14/14)   Status On-going           PT Long Term Goals - 11/01/14 1024    PT LONG TERM GOAL #1   Title I with advanced HEP to include a walking program ( 12/05/14)   Status On-going   PT LONG TERM GOAL #2   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  Pt reported she has tried to perform some of exercises over last two days.  Pt very receptive to ideas of fitting exercise into daily routine.  Progressing towards goals; no goals met this date.    Pt will benefit from skilled therapeutic intervention in order to improve on the following deficits Postural dysfunction;Decreased strength;Hypomobility;Impaired flexibility;Pain;Increased muscle spasms;Decreased range of motion   Rehab Potential Good   PT Frequency 2x / week   PT Duration 6 weeks   PT Treatment/Interventions Iontophoresis 58m/ml Dexamethasone;Traction;Ultrasound;Patient/family education;Dry needling;Cryotherapy;Electrical Stimulation;Moist Heat;Therapeutic exercise;Manual techniques;Taping   PT Next Visit Plan Continue progressive core strengthening, LE stretching. Trial of manual to Rt QL/ hip.    Consulted and Agree with Plan of Care Patient        Problem List Patient Active Problem List   Diagnosis Date Noted  . Piriformis syndrome 09/12/2014  . Right-sided low back pain without sciatica 06/18/2014  . Hip bursitis 06/14/2014  . 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 DDD (degenerative disc disease), lumbar 06/14/2014  . Constipation 07/08/2013  . Insomnia 02/13/2012  . Anxiety 02/13/2012  . 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 Hypothyroidism 03/08/2009   JKerin Perna PTA 11/03/2014 4:07 PM  [place] [address]: [phone]  Fax:  [phone]

## 2014-11-03 NOTE — Patient Instructions (Addendum)
Piriformis Stretch, Sitting PNF   Sit, one ankle on opposite knee, same-side hand on crossed knee. Push down on knee, keeping spine straight. Lean torso forward until tension is felt in hamstrings and gluteals of crossed-leg side. Contract hip muscles so knee rotates further toward floor, resisting with hand. Hold _30__ seconds. Release tension and bring torso closer to calf. Hold _30__ seconds.  Repeat _2__ times per session. Do __1-2_ sessions per day.  Hamstring Stretch (Standing)   Standing,(OR SITTING) place one heel on chair or bench. Use one or both hands on thigh for support. Keeping torso straight, lean forward slowly until a stretch is felt in back of same thigh. Hold _30___ seconds. Repeat with other leg.   Abdominal Bracing With Pelvic Floor (Hook-Lying)   With neutral spine, tighten pelvic floor and abdominals. Hold 5-10 seconds. Repeat __10_ times. Do _1__ times a day.    [facility] Re[facility] at [facility] [address] [place], [place] [address]  [phone] (office) [phone] (fax)

## 2014-11-07 ENCOUNTER — Encounter: Payer: Self-pay | Admitting: Physical Therapy

## 2014-11-08 ENCOUNTER — Ambulatory Visit (INDEPENDENT_AMBULATORY_CARE_PROVIDER_SITE_OTHER): Payer: Federal, State, Local not specified - PPO | Admitting: Physician Assistant

## 2014-11-08 ENCOUNTER — Encounter: Payer: Self-pay | Admitting: Physician Assistant

## 2014-11-08 ENCOUNTER — Other Ambulatory Visit: Payer: Self-pay | Admitting: Physician Assistant

## 2014-11-08 ENCOUNTER — Ambulatory Visit: Payer: Self-pay | Admitting: Physician Assistant

## 2014-11-08 ENCOUNTER — Ambulatory Visit (INDEPENDENT_AMBULATORY_CARE_PROVIDER_SITE_OTHER): Payer: Worker's Compensation | Admitting: Physical Therapy

## 2014-11-08 VITALS — BP 136/86 | HR 88 | Ht 68.0 in | Wt 229.0 lb

## 2014-11-08 DIAGNOSIS — Z13220 Encounter for screening for lipoid disorders: Secondary | ICD-10-CM

## 2014-11-08 DIAGNOSIS — R198 Other specified symptoms and signs involving the digestive system and abdomen: Secondary | ICD-10-CM

## 2014-11-08 DIAGNOSIS — R600 Localized edema: Secondary | ICD-10-CM

## 2014-11-08 DIAGNOSIS — R52 Pain, unspecified: Secondary | ICD-10-CM | POA: Diagnosis not present

## 2014-11-08 DIAGNOSIS — M2560 Stiffness of unspecified joint, not elsewhere classified: Secondary | ICD-10-CM | POA: Diagnosis not present

## 2014-11-08 DIAGNOSIS — Z1321 Encounter for screening for nutritional disorder: Secondary | ICD-10-CM | POA: Diagnosis not present

## 2014-11-08 DIAGNOSIS — E039 Hypothyroidism, unspecified: Secondary | ICD-10-CM

## 2014-11-08 DIAGNOSIS — R252 Cramp and spasm: Secondary | ICD-10-CM

## 2014-11-08 MED ORDER — HYDROCHLOROTHIAZIDE 25 MG PO TABS: 25.0000 mg | ORAL_TABLET | Freq: Every day | ORAL | Status: DC

## 2014-11-08 NOTE — Therapy (Signed)
[facility] [place] [address] [place] [address] [place], [place], [address] Phone: [phone]   Fax:  [phone]  Physical Therapy Treatment  Patient Details  Name: Kristy Jones MRN: [id number] Date of Birth: [birth year] Referring Provider:  Agapito Games, *  Encounter Date: 11/08/2014      PT End of Session - 11/08/14 1529    Visit Number 4   Number of Visits 12   Date for PT Re-Evaluation 12/05/14   PT Start Time 1527   PT Stop Time 1616   PT Time Calculation (min) 49 min      Past Medical History  Diagnosis Date  . Thyroid disease   . Lichen sclerosus     Past Surgical History  Procedure Laterality Date  . C-sections    . Septoplasty      There were no vitals filed for this visit.  Visit Diagnosis:  Pain of multiple sites  Abdominal weakness  Stiffness of joints, multiple sites      Subjective Assessment - 11/08/14 1530    Subjective Pt reported that he repeated exercises the evening of last therapy session; was very sore the next day.  Has only performed HEP one more time since then; just so busy with work and moving.  Pt has tried to make an effort tighten core muscles when exerting self at work and has noticed a difference.    Currently in Pain? Yes   Pain Score 3    Pain Location Hip  SI area   Pain Orientation Right;Lower   Pain Descriptors / Indicators Aching   Aggravating Factors  bending, twisting   Pain Relieving Factors ice, rest, medicine            [facility] PT Assessment - 11/08/14 0001    Assessment   Medical Diagnosis dorsopathies, sacral, intervertebral disc d/o lumbar   Onset Date/Surgical Date 06/14/14   Hand Dominance Right   Next MD Visit 11/27/14   Prior Therapy none                     [facility] Adult PT Treatment/Exercise - 11/08/14 0001    Lumbar Exercises: Stretches   Passive Hamstring Stretch 2 reps;30 seconds  seated, straight back   Lower Trunk Rotation 5 reps;10  seconds  arms in Y position   Piriformis Stretch 3 reps;30 seconds  each leg, supine then seated   Lumbar Exercises: Aerobic   Stationary Bike NuStep L4: 4 min    Lumbar Exercises: Supine   Ab Set 10 reps;5 seconds   Clam 10 reps  with ab set, each leg   Heel Slides 10 reps  each leg; with ab set   Bent Knee Raise 10 reps  each leg; with ab set   Lumbar Exercises: Sidelying   Clam 10 reps  2 sets, each leg   Modalities   Modalities Electrical Stimulation;Moist Heat   Moist Heat Therapy   Number Minutes Moist Heat 15 Minutes   Moist Heat Location Lumbar Spine   Electrical Stimulation   Electrical Stimulation Location lumbar    Electrical Stimulation Action IFC   Electrical Stimulation Parameters to tolerance, 15 min    Electrical Stimulation Goals Pain           PT Education - 11/08/14 1714    Education provided Yes   Education Details Pt given info on TENS unit for home use.    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  Pt reported decrease in pain at end of session with use of estim/MHP.  Pt progressing towards goals.    Pt will benefit from skilled therapeutic intervention in order to improve on the following deficits Postural dysfunction;Decreased strength;Hypomobility;Impaired flexibility;Pain;Increased muscle spasms;Decreased range of motion   Rehab Potential Good   PT Frequency 2x / week   PT Duration 6 weeks   PT Treatment/Interventions Iontophoresis 4mg /ml Dexamethasone;Traction;Ultrasound;Patient/family education;Dry needling;Cryotherapy;Electrical Stimulation;Moist Heat;Therapeutic exercise;Manual techniques;Taping   PT Next Visit Plan Continue progressive core strengthening, LE stretching. Advance HEP    Consulted and Agree with Plan of Care Patient        Problem List Patient Active Problem List   Diagnosis Date Noted  . Piriformis syndrome 09/12/2014  . Right-sided low back pain without sciatica 06/18/2014  . Hip bursitis 06/14/2014  . 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 DDD (degenerative disc disease), lumbar 06/14/2014  . Constipation 07/08/2013  . Insomnia 02/13/2012  . Anxiety 02/13/2012  . 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 Hypothyroidism 03/08/2009    Mayer Camel, PTA 11/08/2014 5:17 PM  [facility] [place] [address] [place] [address] [place], [place], [address] Phone: [phone]   Fax:  [phone]

## 2014-11-08 NOTE — Patient Instructions (Signed)
Peripheral Edema °You have swelling in your legs (peripheral edema). This swelling is due to excess accumulation of salt and water in your body. Edema may be a sign of heart, kidney or liver disease, or a side effect of a medication. It may also be due to problems in the leg veins. Elevating your legs and using special support stockings may be very helpful, if the cause of the swelling is due to poor venous circulation. Avoid long periods of standing, whatever the cause. °Treatment of edema depends on identifying the cause. Chips, pretzels, pickles and other salty foods should be avoided. Restricting salt in your diet is almost always needed. Water pills (diuretics) are often used to remove the excess salt and water from your body via urine. These medicines prevent the kidney from reabsorbing sodium. This increases urine flow. °Diuretic treatment may also result in lowering of potassium levels in your body. Potassium supplements may be needed if you have to use diuretics daily. Daily weights can help you keep track of your progress in clearing your edema. You should call your caregiver for follow up care as recommended. °SEEK IMMEDIATE MEDICAL CARE IF:  °· You have increased swelling, pain, redness, or heat in your legs. °· You develop shortness of breath, especially when lying down. °· You develop chest or abdominal pain, weakness, or fainting. °· You have a fever. °Document Released: 03/20/2004 Document Revised: 05/05/2011 Document Reviewed: 02/28/2009 °ExitCare® Patient Information ©2015 [employer]. This information is not intended to replace advice given to you by your health care provider. Make sure you discuss any questions you have with your health care provider. ° °

## 2014-11-08 NOTE — Progress Notes (Addendum)
   Subjective:    Patient ID: Kristy Jones, female    DOB: 1955/08/24, 59 y.o.   MRN: [id number]  HPI Patient presents to clinic with bilateral lower extremity swelling. She reports she has experienced swelling for the past few months. She states that swelling has typically gone down after work, but over the past 2 weeks swelling has persisted through the day/night. She reports her lower extremities feel "heavy, stiff, and achy". She is also experiencing parasethesias mid way up her legs bilaterally as well as muscle cramping at night. She denies SOB, cough, or chest pain. She also denies changes in urinary frequency or increased salt intake. 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 She also reports increased alcohol consumption due to increased stress.      Review of Systems Positive for symptoms listed in the HPI    Objective:   Physical Exam  Constitutional: Well nourished, well developed, and not under acute distress Cardiac: Normal S1, S2. No M/G/R Pulm: CTAB Vascular: Mild swelling bilaterally throughout ankles and feet. Swelling is more significant in right ankle. 1+ pitting edema       Assessment & Plan:  Hypothyroid- Order thyroid panel to evaluate TSH, free T3, free T4  Lower extremity swelling/cramping- Order CMP to evaluate renal and liver function. Rx 25 mg hydrochlorothiazide tablets, prn for swelling. Discussed conservative treatment with decreasing salt intake, compression, elevation.consider magnesium for muscle cramps. Encouraged warm bath at night before bed.   Needs CPe. Fasting labs ordered.   Reviewed by Tandy Gaw PA-C

## 2014-11-09 ENCOUNTER — Encounter: Payer: Self-pay | Admitting: Physical Therapy

## 2014-11-09 DIAGNOSIS — R252 Cramp and spasm: Secondary | ICD-10-CM | POA: Insufficient documentation

## 2014-11-09 DIAGNOSIS — R600 Localized edema: Secondary | ICD-10-CM | POA: Insufficient documentation

## 2014-11-09 LAB — COMPLETE METABOLIC PANEL WITH GFR
ALBUMIN: 3.5 g/dL — AB (ref 3.6–5.1)
ALT: 18 U/L (ref 6–29)
AST: 21 U/L (ref 10–35)
Alkaline Phosphatase: 46 U/L (ref 33–130)
BILIRUBIN TOTAL: 0.6 mg/dL (ref 0.2–1.2)
BUN: 13 mg/dL (ref 7–25)
CO2: 27 mmol/L (ref 20–31)
Calcium: 9 mg/dL (ref 8.6–10.4)
Chloride: 107 mmol/L (ref 98–110)
Creat: 0.66 mg/dL (ref 0.50–1.05)
GFR, Est African American: >89 mL/min (ref >=60)
GLUCOSE: 95 mg/dL (ref 65–99)
Potassium: 4.4 mmol/L (ref 3.5–5.3)
SODIUM: 144 mmol/L (ref 135–146)
TOTAL PROTEIN: 5.3 g/dL — AB (ref 6.1–8.1)

## 2014-11-09 LAB — LIPID PANEL
Cholesterol: 171 mg/dL (ref 125–200)
HDL: 66 mg/dL (ref >=46)
LDL Cholesterol: 93 mg/dL (ref <130)
Total CHOL/HDL Ratio: 2.6 Ratio (ref <=5.0)
Triglycerides: 60 mg/dL (ref <150)
VLDL: 12 mg/dL (ref <30)

## 2014-11-10 ENCOUNTER — Ambulatory Visit (INDEPENDENT_AMBULATORY_CARE_PROVIDER_SITE_OTHER): Payer: Worker's Compensation | Admitting: Physical Therapy

## 2014-11-10 DIAGNOSIS — R198 Other specified symptoms and signs involving the digestive system and abdomen: Secondary | ICD-10-CM

## 2014-11-10 DIAGNOSIS — R52 Pain, unspecified: Secondary | ICD-10-CM

## 2014-11-10 DIAGNOSIS — M2560 Stiffness of unspecified joint, not elsewhere classified: Secondary | ICD-10-CM | POA: Diagnosis not present

## 2014-11-10 LAB — VITAMIN D 25 HYDROXY (VIT D DEFICIENCY, FRACTURES): Vit D, 25-Hydroxy: 25 ng/mL — ABNORMAL LOW (ref 30–100)

## 2014-11-10 LAB — T3, FREE: T3 FREE: 2.9 pg/mL (ref 2.3–4.2)

## 2014-11-10 LAB — T4, FREE: Free T4: 0.72 ng/dL — ABNORMAL LOW (ref 0.80–1.80)

## 2014-11-10 LAB — TSH: TSH: 2.63 u[IU]/mL (ref 0.350–4.500)

## 2014-11-10 NOTE — Patient Instructions (Signed)
  Abdominal Bracing With Pelvic Floor (Hook-Lying)   With neutral spine, tighten pelvic floor and abdominals. Hold 5-10 seconds- Don't hold breath. Repeat __10_ times. Do _1__ times a day. These are same muscles to engage when lifting items or transitioning from sitting.   Knee to Chest: Transverse Plane Stability  Engage abdominal muscles.  Bring one knee up, then return. Be sure pelvis does not roll side to side. Keep pelvis still. Lift knee __10_ times each leg. Restabilize pelvis. Repeat with other leg. Do _1-2__ sets, _1__ times per day.    Hip External Rotation With Pillow: Transverse Plane Stability  Engage abdominal muscles.  KEEP BOTH KNEES BENT.  Slowly roll bent knee out. Be sure pelvis does not rotate. Do _10__ times. Restabilize pelvis. Repeat with other leg. Do _1-2__ sets, _1__ times per day.  Heel Slide: 4-10 Inches - Transverse Plane Stability  Engage abdominal muscles.  Slide heel 4 inches down. Be sure pelvis does not rotate. Do _10__ times. Restabilize pelvis. Repeat with other leg. Do __1_ sets, _1__ times per day.   [facility] Out[facility] Rehab at [facility] [address] [place], [place] [address]  [phone] (office) [phone] (fax)

## 2014-11-10 NOTE — Therapy (Signed)
[facility] [place] [address] [place] [address] [place], [place], [address] Phone: [phone]   Fax:  [phone]  Physical Therapy Treatment  Patient Details  Name: Kristy Jones MRN: [id number] Date of Birth: 22-May-1955 Referring Provider:  Jomarie Longs, PA-C  Encounter Date: 11/10/2014      PT End of Session - 11/10/14 1527    Visit Number 5   Number of Visits 12   Date for PT Re-Evaluation 12/05/14   PT Start Time 1523   PT Stop Time 1619   PT Time Calculation (min) 56 min   Activity Tolerance Patient tolerated treatment well      Past Medical History  Diagnosis Date  . Thyroid disease   . Lichen sclerosus     Past Surgical History  Procedure Laterality Date  . C-sections    . Septoplasty      There were no vitals filed for this visit.  Visit Diagnosis:  No diagnosis found.      Subjective Assessment - 11/10/14 1529    Subjective Pt reported she was busy packing and wasn't able to perform HEP.  She said she is trying to engage core more with every day tasks.    Currently in Pain? Yes   Pain Score 1    Pain Location --  SI joint   Pain Orientation Right   Pain Descriptors / Indicators Aching   Aggravating Factors  bending, twisting   Pain Relieving Factors ice, rest, medicine.             [facility] PT Assessment - 11/10/14 0001    Assessment   Medical Diagnosis dorsopathies, sacral, intervertebral disc d/o lumbar   Onset Date/Surgical Date 06/14/14   Hand Dominance Right   Next MD Visit 11/27/14   Prior Therapy none          [facility] Adult PT Treatment/Exercise - 11/10/14 0001    Lumbar Exercises: Stretches   Passive Hamstring Stretch 2 reps;30 seconds  each leg, with strap   Lower Trunk Rotation 5 reps;10 seconds  arms in Y position   ITB Stretch 1 rep;30 seconds  each leg, with strap    Piriformis Stretch 3 reps;30 seconds  each leg, seated   Lumbar Exercises: Aerobic   Stationary Bike NuStep L4: 6  min    Lumbar Exercises: Supine   Ab Set 10 reps;5 seconds   Clam 10 reps  with ab set, each leg   Heel Slides 10 reps  each leg; with ab set   Isometric Hip Flexion 10 reps;5 seconds  alternating sides   Lumbar Exercises: Sidelying   Other Sidelying Lumbar Exercises quad stretch x 40 sec x 2 reps each side. (with strap)   Modalities   Modalities Electrical Stimulation;Moist Heat   Moist Heat Therapy   Number Minutes Moist Heat 15 Minutes   Moist Heat Location Lumbar Spine   Electrical Stimulation   Electrical Stimulation Location lumbar    Electrical Stimulation Action IFC   Electrical Stimulation Parameters to tolerance x 15 min    Electrical Stimulation Goals Pain            PT Education - 11/10/14 1616    Education provided Yes   Education Details HEP    Person(s) Educated Patient   Methods Handout;Explanation;Demonstration   Comprehension Verbalized understanding;Returned demonstration          PT Short Term Goals - 11/01/14 1025    PT SHORT TERM GOAL #1   Title I with  initial HEP (11/14/14)   Status On-going   PT SHORT TERM GOAL #2   Title demo Rt hip abduction strength =/> 5-/5 without pain ( 11/14/14)   Status On-going           PT Long Term Goals - 11/01/14 1024    PT LONG TERM GOAL #1   Title I with advanced HEP to include a walking program ( 12/05/14)   Status On-going   PT LONG TERM GOAL #2   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  Pt reporting overall decrease in pain.  Pt was unable to tolerate prone quad stretch (very tight quads; caused back spasm) - once positioned on side, was able to complete stretch.Pt needs some encouragement to remain consistent with HEP.  Progressing towards goals.    Pt will benefit from skilled therapeutic intervention in order to improve on the following deficits Postural dysfunction;Decreased strength;Hypomobility;Impaired flexibility;Pain;Increased muscle spasms;Decreased range of motion   Rehab Potential Good   PT Frequency 2x / week   PT Duration 6 weeks   PT Treatment/Interventions Iontophoresis 4mg /ml Dexamethasone;Traction;Ultrasound;Patient/family education;Dry needling;Cryotherapy;Electrical Stimulation;Moist Heat;Therapeutic exercise;Manual techniques;Taping   PT Next Visit Plan Continue progressive core strengthening, LE stretching. Advance HEP    Consulted and Agree with Plan of Care Patient        Problem List Patient Active Problem List   Diagnosis Date Noted  . Muscle cramps 11/09/2014  . Edema of right lower extremity 11/09/2014  . Piriformis syndrome 09/12/2014  . Right-sided low back pain without sciatica 06/18/2014  . Hip bursitis 06/14/2014  . 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 DDD (degenerative disc disease), lumbar 06/14/2014  . Constipation 07/08/2013  . Insomnia 02/13/2012  . Anxiety 02/13/2012  . 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 Hypothyroidism 03/08/2009    Mayer Camel, PTA 11/10/2014 4:17 PM  [facility] [place] [address] [place] [address] [place], [place], [address] Phone: [phone]   Fax:  [phone]

## 2014-11-14 ENCOUNTER — Encounter: Payer: Self-pay | Admitting: Physical Therapy

## 2014-11-15 ENCOUNTER — Encounter: Payer: Self-pay | Admitting: Physical Therapy

## 2014-11-17 ENCOUNTER — Encounter: Payer: Self-pay | Admitting: Physical Therapy

## 2014-11-20 ENCOUNTER — Ambulatory Visit: Payer: Self-pay | Admitting: Physician Assistant

## 2014-11-27 ENCOUNTER — Encounter: Payer: Self-pay | Admitting: Physician Assistant

## 2014-11-27 ENCOUNTER — Ambulatory Visit (INDEPENDENT_AMBULATORY_CARE_PROVIDER_SITE_OTHER): Payer: Worker's Compensation | Admitting: Physician Assistant

## 2014-11-27 ENCOUNTER — Ambulatory Visit: Payer: Self-pay | Admitting: Physician Assistant

## 2014-11-27 ENCOUNTER — Encounter: Payer: Self-pay | Admitting: Rehabilitative and Restorative Service Providers"

## 2014-11-27 VITALS — BP 142/88 | HR 82 | Ht 68.0 in | Wt 227.0 lb

## 2014-11-27 DIAGNOSIS — M7071 Other bursitis of hip, right hip: Secondary | ICD-10-CM | POA: Diagnosis not present

## 2014-11-27 DIAGNOSIS — M533 Sacrococcygeal disorders, not elsewhere classified: Secondary | ICD-10-CM

## 2014-11-27 DIAGNOSIS — G5701 Lesion of sciatic nerve, right lower limb: Secondary | ICD-10-CM

## 2014-11-27 NOTE — Progress Notes (Signed)
   Subjective:    Patient ID: Kristy Jones, female    DOB: March 21, 1955, 59 y.o.   MRN: [id number]  HPI  Pt presents to the clinic for 4 week follow up on right hip bursitis, piriformis, SI joint dysfunction. She reports to be doing 25-30 percent better. She did not go to PT last week due to being at the beach. PT has helped. 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 Last injection was less than 3 months ago. She is taking tumeric one capsule daily. Not taking mobic.      Review of Systems  All other systems reviewed and are negative.      Objective:   Physical Exam  Constitutional: She is oriented to person, place, and time. She appears well-developed and well-nourished.  HENT:  Head: Normocephalic and atraumatic.  Cardiovascular: Normal rate, regular rhythm and normal heart sounds.   Pulmonary/Chest: Effort normal and breath sounds normal.  Neurological: She is alert and oriented to person, place, and time.  Psychiatric: She has a normal mood and affect. Her behavior is normal.          Assessment & Plan:  Right hip bursitis/piriformis/SI joint dysfunction- continue PT. Increase tumeric to 2 capsules. Use pennsaid along with. Follow up after being released from PT. Will consider MRI if not improving. Discussed sports med referral. She will hold off for now.

## 2014-11-29 ENCOUNTER — Ambulatory Visit (INDEPENDENT_AMBULATORY_CARE_PROVIDER_SITE_OTHER): Payer: Worker's Compensation | Admitting: Physical Therapy

## 2014-11-29 DIAGNOSIS — R198 Other specified symptoms and signs involving the digestive system and abdomen: Secondary | ICD-10-CM

## 2014-11-29 DIAGNOSIS — R52 Pain, unspecified: Secondary | ICD-10-CM | POA: Diagnosis not present

## 2014-11-29 DIAGNOSIS — M2560 Stiffness of unspecified joint, not elsewhere classified: Secondary | ICD-10-CM | POA: Diagnosis not present

## 2014-11-29 NOTE — Therapy (Signed)
[place], [place], [address] Phone: [phone]   Fax:  [phone]  Physical Therapy Treatment  Patient Details  Name: Kristy Jones MRN: [id number] Date of Birth: 1955/10/24 Referring Provider:  Hali Marry, *  Encounter Date: 11/29/2014      PT End of Session - 11/29/14 1519    Visit Number 6   Number of Visits 12   Date for PT Re-Evaluation 12/05/14   PT Start Time 6644   PT Stop Time 1602   PT Time Calculation (min) 45 min      Past Medical History  Diagnosis Date  . Thyroid disease   . Lichen sclerosus     Past Surgical History  Procedure Laterality Date  . C-sections    . Septoplasty      There were no vitals filed for this visit.  Visit Diagnosis:  Pain of multiple sites  Abdominal weakness  Stiffness of joints, multiple sites      Subjective Assessment - 11/29/14 1519    Subjective Pt took last 2.5 wks off of therapy due to beach trip and busy work schedule.  Back and hip pain continue to persist, "but it's not as bad as it was". Pt reports she has been too busy to complete HEP.    Currently in Pain? Yes   Pain Score 3    Pain Location --  Rt SI joint   Pain Descriptors / Indicators Aching   Aggravating Factors  bending and twisting   Pain Relieving Factors ice, rest, medicine             [facility] PT Assessment - 11/29/14 0001    Assessment   Medical Diagnosis dorsopathies, sacral, intervertebral disc d/o lumbar   Onset Date/Surgical Date 06/14/14   Hand Dominance Right   Next MD Visit 12/21/14            [facility] Adult PT Treatment/Exercise - 11/29/14 0001    Lumbar Exercises: Stretches   Passive Hamstring Stretch 2 reps;30 seconds  seated, straight back   Lower Trunk Rotation 5 reps;10 seconds  arms in Y position   ITB Stretch 1 rep;30 seconds  each leg, with strap    Piriformis Stretch 3 reps;30 seconds  each leg, seated   Lumbar  Exercises: Aerobic   Stationary Bike NuStep L4: 6 min    Lumbar Exercises: Supine   Bridge 10 reps  with ball squeeze   Lumbar Exercises: Sidelying   Clam 10 reps  2 sets, each leg   Other Sidelying Lumbar Exercises thoracic rotation in Lt side lying x 10 reps    Modalities   Modalities Ultrasound   Ultrasound   Ultrasound Location Rt hip and glutes    Ultrasound Parameters 100%, 1.0 mHz, 1.4 w/cm2, x 10 min    Ultrasound Goals Pain  tissue restrictions.    Manual Therapy   Manual Therapy Soft tissue mobilization   Soft tissue mobilization with Edge tool assistance to Rt ITB, lateral hamstring, Rt glutes/ piriformis. 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  Pt reported decrease in Rt hip/SI joint pain after combo.  No goals met this visit.    Pt will benefit from skilled therapeutic intervention in order to improve on the following deficits Postural  dysfunction;Decreased strength;Hypomobility;Impaired flexibility;Pain;Increased muscle spasms;Decreased range of motion   Rehab Potential Good   PT Frequency 2x / week   PT Duration 6 weeks   PT Treatment/Interventions Iontophoresis 6m/ml Dexamethasone;Traction;Ultrasound;Patient/family education;Dry needling;Cryotherapy;Electrical Stimulation;Moist Heat;Therapeutic exercise;Manual techniques;Taping   PT Next Visit Plan Assess response to combo and compliance to simplified HEP.   Continue core/hip strengthening.         Problem List Patient Active Problem List   Diagnosis Date Noted  . Muscle cramps 11/09/2014  . Edema of right lower extremity 11/09/2014  . Piriformis syndrome 09/12/2014  . Right-sided low back pain without sciatica 06/18/2014  . Hip bursitis 06/14/2014  . 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 DDD (degenerative disc disease), lumbar 06/14/2014  . Constipation 07/08/2013  . Insomnia 02/13/2012  . Anxiety 02/13/2012  . 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 Hypothyroidism 03/08/2009   JKerin Perna PTA 11/29/2014 5:11 PM  [facility] [place] [address]Phone: [phone]  Fax:  [phone]

## 2014-12-01 ENCOUNTER — Ambulatory Visit (INDEPENDENT_AMBULATORY_CARE_PROVIDER_SITE_OTHER): Payer: Worker's Compensation | Admitting: Physical Therapy

## 2014-12-01 DIAGNOSIS — M2560 Stiffness of unspecified joint, not elsewhere classified: Secondary | ICD-10-CM

## 2014-12-01 DIAGNOSIS — R198 Other specified symptoms and signs involving the digestive system and abdomen: Secondary | ICD-10-CM

## 2014-12-01 DIAGNOSIS — R52 Pain, unspecified: Secondary | ICD-10-CM | POA: Diagnosis not present

## 2014-12-01 NOTE — Therapy (Addendum)
[place], [place], [address] Phone: [phone]   Fax:  [phone]  Physical Therapy Treatment  Patient Details  Name: Kristy Jones MRN: [id number] Date of Birth: 1955-05-25 Referring Provider:  Hali Marry, *  Encounter Date: 12/01/2014      PT End of Session - 12/01/14 1459    Visit Number 7   Number of Visits 17   Date for PT Re-Evaluation 01/31/15   PT Start Time 1500   PT Stop Time 1550   PT Time Calculation (min) 50 min   Activity Tolerance No increased pain;Patient tolerated treatment well      Past Medical History  Diagnosis Date  . Thyroid disease   . Lichen sclerosus     Past Surgical History  Procedure Laterality Date  . C-sections    . Septoplasty      There were no vitals filed for this visit.  Visit Diagnosis:  Pain of multiple sites  Abdominal weakness  Stiffness of joints, multiple sites      Subjective Assessment - 12/01/14 1500    Subjective Pt reports she completed exercises and things are feeling improved. Sitting while bending and twisting, as well has prolonged standing is still an aggrivating factor.    Currently in Pain? Yes   Pain Score 1    Pain Location --  Rt SI joint    Pain Orientation Right   Pain Descriptors / Indicators Aching            [facility] PT Assessment - 12/01/14 0001    Assessment   Medical Diagnosis dorsopathies, sacral, intervertebral disc d/o lumbar   Onset Date/Surgical Date 06/14/14   Hand Dominance Right   Next MD Visit 12/21/14   AROM   AROM Assessment Site Lumbar   Lumbar Flexion to mid shin   Lumbar Extension WNL  no pain   Lumbar - Right Side Bend WNL  no pain   Lumbar - Left Side Bend WNL   Lumbar - Right Rotation WNL   Lumbar - Left Rotation WNL   Strength   Strength Assessment Site Hip   Right/Left Hip Right;Left   Right Hip ABduction --  5-/5   Left Hip ABduction 5/5            [facility]  Adult PT Treatment/Exercise - 12/01/14 0001    Lumbar Exercises: Stretches   Passive Hamstring Stretch 30 seconds;4 reps  seated, straight back   Lower Trunk Rotation 5 reps;10 seconds  arms in Y position   Quad Stretch 2 reps;20 seconds  each leg   ITB Stretch 60 seconds;30 seconds;2 reps  each leg, with strap    Piriformis Stretch 30 seconds;4 reps  each leg, seated   Lumbar Exercises: Aerobic   Stationary Bike NuStep L4: 6 min    Lumbar Exercises: Standing   Other Standing Lumbar Exercises sit to/from stand with core engaged x 8 reps    Other Standing Lumbar Exercises SLS x 20 sec x 3 reps each leg; hip abd with UE support x 10 reps each leg.    Lumbar Exercises: Supine   Ab Set 10 reps;5 seconds   Clam 10 reps  with ab set, each leg   Lumbar Exercises: Sidelying   Clam 10 reps  2 sets, each leg   Lumbar Exercises: Prone   Other Prone Lumbar Exercises Pelvic press x 5 sec hold x 5 reps; with unilateral knee flexion x 8 reps each leg.  PT Education - 12/01/14 1614    Education provided Yes   Education Details HEP - expanded to include hip/thigh stretches.    Person(s) Educated Patient   Methods Explanation   Comprehension Returned demonstration;Verbalized understanding          PT Short Term Goals - 12/01/14 1612    PT SHORT TERM GOAL #1   Title I with initial HEP (11/14/14)   Time 3   Period Weeks   Status Achieved   PT SHORT TERM GOAL #2   Title demo Rt hip abduction strength =/> 5-/5 without pain ( 11/14/14)   Time 3   Period Weeks   Status Partially Met           PT Long Term Goals - 12/01/14 1552    PT LONG TERM GOAL #1   Title I with advanced HEP to include a walking program ( 12/05/14)   Time 6   Period Weeks   Status On-going   PT LONG TERM GOAL #2   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)   Time 6   Period Weeks   Status Achieved   PT LONG TERM GOAL #3   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  Pt making good gains towards goals, has met LTG#2 and 5.  Pt unsure of coverage of visits; wishes treatment to be placed on hold while this is investigated.    Pt will benefit from skilled therapeutic intervention in order to improve on the following deficits Postural dysfunction;Decreased strength;Hypomobility;Impaired flexibility;Pain;Increased muscle spasms;Decreased range of motion   Rehab Potential Good   PT Frequency 2x / week   PT Duration 5 weeks   PT Treatment/Interventions Iontophoresis 79m/ml Dexamethasone;Traction;Ultrasound;Patient/family education;Dry needling;Cryotherapy;Electrical Stimulation;Moist Heat;Therapeutic exercise;Manual techniques;Taping   PT Next Visit Plan Hold therapy treatment while insurance coverage is verified.  If continued, progress strengthening to core/ bilat hip.    Consulted and Agree with Plan of Care Patient        Problem List Patient Active Problem List   Diagnosis Date Noted  . Muscle cramps 11/09/2014  . Edema of right lower extremity 11/09/2014  . Piriformis syndrome 09/12/2014  . Right-sided low back pain without sciatica 06/18/2014  . Hip bursitis 06/14/2014  . 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 DDD (degenerative disc disease), lumbar 06/14/2014  . Constipation 07/08/2013  . Insomnia 02/13/2012  . Anxiety 02/13/2012  . 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 Hypothyroidism  03/08/2009    JKerin Perna PTA 12/01/2014 4:15 PM  [facility] [place] [address] [place] [address]Phone: [phone]  Fax:  [phone]  Note revised and re-certification sent to MD  SJeral Pinch  PT 12/20/2014 11:32 AM

## 2014-12-04 ENCOUNTER — Other Ambulatory Visit: Payer: Self-pay | Admitting: Physician Assistant

## 2014-12-06 NOTE — Telephone Encounter (Signed)
Has not been seen specifically for anxiety since 2014.acm

## 2014-12-20 NOTE — Addendum Note (Signed)
Addended by: SHAVER, SUSAN E on: 12/20/2014 11:30 AM   Modules accepted: Orders

## 2014-12-21 ENCOUNTER — Encounter: Payer: Self-pay | Admitting: Physician Assistant

## 2014-12-21 ENCOUNTER — Encounter: Payer: Self-pay | Admitting: Family Medicine

## 2014-12-21 ENCOUNTER — Ambulatory Visit (INDEPENDENT_AMBULATORY_CARE_PROVIDER_SITE_OTHER): Payer: Worker's Compensation | Admitting: Physician Assistant

## 2014-12-21 ENCOUNTER — Ambulatory Visit (INDEPENDENT_AMBULATORY_CARE_PROVIDER_SITE_OTHER): Payer: Worker's Compensation | Admitting: Family Medicine

## 2014-12-21 VITALS — BP 120/85 | HR 85 | Ht 68.0 in | Wt 228.0 lb

## 2014-12-21 VITALS — BP 120/85 | HR 85 | Ht 68.0 in | Wt 230.0 lb

## 2014-12-21 DIAGNOSIS — M533 Sacrococcygeal disorders, not elsewhere classified: Secondary | ICD-10-CM | POA: Diagnosis not present

## 2014-12-21 DIAGNOSIS — M545 Low back pain, unspecified: Secondary | ICD-10-CM

## 2014-12-21 DIAGNOSIS — G5701 Lesion of sciatic nerve, right lower limb: Secondary | ICD-10-CM

## 2014-12-21 NOTE — Progress Notes (Signed)
   Subjective:    Patient ID: Kristy Jones, female    DOB: 01/23/1956, 59 y.o.   MRN: [id number]  HPI Patient is a 59 year old female who presents to the clinic to follow-up on her right-sided low back pain without sciatica, piriformis syndrome, right hip bursitis and right sided SI joint dysfunction. She has done approximately 6 weeks of physical therapy with significant improvement. She cannot continue with physical therapy because insurance will not pay for this. She continues to work her regular schedule.    Review of Systems  All other systems reviewed and are negative.      Objective:   Physical Exam  Constitutional: She appears well-developed and well-nourished.  Cardiovascular: Normal rate, regular rhythm and normal heart sounds.   Musculoskeletal:  Pain to palpation over right lower back in the SI joint region.  Tenderness to palpation over right hip.  Tenderness over gluteal area more along the lateral side.  Straight leg test normal, bilaterally.  Normal ROM at hip.  IT band without tenderness.          Assessment & Plan:  Right-sided low back pain without siatica/piriformis/right sided SI joint dysfunction- patient continue to do home exercises. Her insurance will no longer pay for physical therapy. NSAIDs aggravate her stomach. She is using pennsaid regularly. She has added tumeric to her daily usage. Her pain is mostly controlled. I think she may need to consider an SI joint injection or other intervention. We will make appointment with Dr. Clementeen GrahamEvan Jones today at 1:15 for further evaluation.

## 2014-12-21 NOTE — Progress Notes (Signed)
   Subjective:    I'm seeing this patient as a consultation for:  Kristy GawJade Breeback PA-C  CC: Right SI joint pain  HPI: Patient has ongoing back and hip pain. She's been seen by her PCP and physical therapy multiple times for this. This isolated one of her problems to the right SI joint. She has failed conservative therapy and is interested in injection of possible today. She denies any current radiating pain weakness or numbness fevers or chills. Pain is worse with prolonged sitting and prolonged standing.  Past medical history, Surgical history, Family history not pertinant except as noted below, Social history, Allergies, and medications have been entered into the medical record, reviewed, and no changes needed.   Review of Systems: No headache, visual changes, nausea, vomiting, diarrhea, constipation, dizziness, abdominal pain, skin rash, fevers, chills, night sweats, weight loss, swollen lymph nodes, body aches, joint swelling, muscle aches, chest pain, shortness of breath, mood changes, visual or auditory hallucinations.   Objective:    Filed Vitals:   12/21/14 1324  BP: 120/85  Pulse: 85   General: Well Developed, well nourished, and in no acute distress.  Neuro/Psych: Alert and oriented x3, extra-ocular muscles intact, able to move all 4 extremities, sensation grossly intact. Skin: Warm and dry, no rashes noted.  Respiratory: Not using accessory muscles, speaking in full sentences, trachea midline.  Cardiovascular: Pulses palpable, no extremity edema. Abdomen: Does not appear distended. MSK: Back: Nontender to midline. Tender to palpation right SI joint. Pain with rotation of the pelvis. Pain with pretzel stretch on the right. Normal gait and  strength bilateral lower extremities.  X-ray lumbar spine dated April 2016 reviewed.  Procedure: Real-time Ultrasound Guided Injection of right SI joint  Device: GE Logiq E  Images permanently stored and available for review in the  ultrasound unit. Verbal informed consent obtained. Discussed risks and benefits of procedure. Warned about infection bleeding damage to structures skin hypopigmentation and fat atrophy among others. Patient expresses understanding and agreement Time-out conducted.  Noted no overlying erythema, induration, or other signs of local infection.  Skin prepped in a sterile fashion.  Local anesthesia: Topical Ethyl chloride.  With sterile technique and under real time ultrasound guidance: 40 mg of Kenalog and 4 mL of Marcaine injected easily.  Completed without difficulty  Pain immediately resolved suggesting accurate placement of the medication.  Advised to call if fevers/chills, erythema, induration, drainage, or persistent bleeding.  Images permanently stored and available for review in the ultrasound unit.  Impression: Technically successful ultrasound guided injection.   No results found for this or any previous visit (from the past 24 hour(s)). No results found.  Impression and Recommendations:   This case required medical decision making of moderate complexity.

## 2014-12-21 NOTE — Assessment & Plan Note (Signed)
Status post injection today. Recheck in a few weeks. 

## 2014-12-21 NOTE — Patient Instructions (Signed)
Thank you for coming in today. Call or go to the ER if you develop a large red swollen joint with extreme pain or oozing puss.  Return in 2-3 weeks if not better.

## 2015-01-03 ENCOUNTER — Ambulatory Visit (INDEPENDENT_AMBULATORY_CARE_PROVIDER_SITE_OTHER): Payer: Worker's Compensation | Admitting: Physical Therapy

## 2015-01-03 DIAGNOSIS — R52 Pain, unspecified: Secondary | ICD-10-CM | POA: Diagnosis not present

## 2015-01-03 DIAGNOSIS — R198 Other specified symptoms and signs involving the digestive system and abdomen: Secondary | ICD-10-CM | POA: Diagnosis not present

## 2015-01-03 DIAGNOSIS — M2560 Stiffness of unspecified joint, not elsewhere classified: Secondary | ICD-10-CM

## 2015-01-03 NOTE — Therapy (Addendum)
[place], [place], [address] Phone: [phone]   Fax:  [phone]  Physical Therapy Treatment  Patient Details  Name: Kristy Jones MRN: [id number] Date of Birth: 12/31/1955 No Data Recorded  Encounter Date: 01/03/2015      PT End of Session - 01/03/15 1530    Visit Number 8   Number of Visits 12   Date for PT Re-Evaluation 12/05/14   PT Start Time 1525   PT Stop Time 1602   PT Time Calculation (min) 37 min      Past Medical History  Diagnosis Date  . Thyroid disease   . Lichen sclerosus     Past Surgical History  Procedure Laterality Date  . C-sections    . Septoplasty      There were no vitals filed for this visit.  Visit Diagnosis:  Pain of multiple sites  Abdominal weakness  Stiffness of joints, multiple sites      Subjective Assessment - 01/03/15 1530    Subjective Pt reports she feels well after receiving injection in Rt SI 2 wks ago.  Pt interested in finalizing HEP to keep painfree.    Currently in Pain? No/denies            [facility] PT Assessment - 01/03/15 0001    Assessment   Medical Diagnosis dorsopathies, sacral, intervertebral disc d/o lumbar   Onset Date/Surgical Date 06/14/14   Hand Dominance Right   AROM   AROM Assessment Site Lumbar   Lumbar Flexion to mid shin   Lumbar Extension WNL  no pain   Lumbar - Right Side Bend WNL  no pain   Lumbar - Left Side Bend WNL   Lumbar - Right Rotation WNL   Lumbar - Left Rotation WNL   Strength   Strength Assessment Site Hip   Right/Left Hip Right;Left   Right Hip ABduction 5/5   Left Hip ABduction 5/5          [facility] Adult PT Treatment/Exercise - 01/03/15 0001    Lumbar Exercises: Stretches   Passive Hamstring Stretch 30 seconds;4 reps  seated, straight back   Lower Trunk Rotation 5 reps;10 seconds  arms in Y position   ITB Stretch 60 seconds;30 seconds;2 reps  each leg, with strap    Piriformis  Stretch 30 seconds;4 reps  each leg, seated   Lumbar Exercises: Standing   Other Standing Lumbar Exercises Doorway stretch x 30 sec each side x 2 reps    Other Standing Lumbar Exercises 30# box lift from floor to waist, walked 50 ft. No pain or discomfort.    Lumbar Exercises: Supine   Bridge 15 reps;3 seconds   Lumbar Exercises: Sidelying   Clam 10 reps  2 sets, each leg   Other Sidelying Lumbar Exercises thoracic rotation in Lt/Rt side lying x 10 reps           PT Short Term Goals - 12/01/14 1612    PT SHORT TERM GOAL #1   Title I with initial HEP (11/14/14)   Time 3   Period Weeks   Status Achieved   PT SHORT TERM GOAL #2   Title demo Rt hip abduction strength =/> 5-/5 without pain ( 11/14/14)   Time 3   Period Weeks   Status Achieved           PT Long Term Goals - 01/03/15 1534    PT LONG TERM GOAL #1   Title I with advanced  HEP to include a walking program ( 12/05/14)   Time 6   Period Weeks   Status Achieved   PT LONG TERM GOAL #2   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)   Time 6   Period Weeks   Status Achieved   PT LONG TERM GOAL #3   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    Time 6   Period Weeks   Status Achieved               Plan - 01/03/15 1703    Clinical Impression Statement Pt tolerated all exercises and box carry without any production of pain.  Pt was given strategies to assist  with HEP compliance; pt agreeable to try.  Pt has met all goals; requests to D/C to HEP.    Pt will benefit from skilled therapeutic intervention in order to improve on the following deficits Postural dysfunction;Decreased strength;Hypomobility;Impaired flexibility;Pain;Increased muscle spasms;Decreased range of motion   Rehab Potential Good   PT Frequency 2x / week   PT Duration 6 weeks   PT Treatment/Interventions Iontophoresis 86m/ml Dexamethasone;Traction;Ultrasound;Patient/family education;Dry needling;Cryotherapy;Electrical Stimulation;Moist Heat;Therapeutic exercise;Manual techniques;Taping   PT Next Visit Plan Spoke to supervising PT; will d/c to HEP.    Consulted and Agree with Plan of Care Patient        Problem List Patient Active Problem List   Diagnosis Date Noted  . Muscle cramps 11/09/2014  . Edema of right lower extremity 11/09/2014  . Piriformis syndrome 09/12/2014  . Right-sided low back pain without sciatica 06/18/2014  . Hip bursitis 06/14/2014  . 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 DDD (degenerative disc disease), lumbar 06/14/2014  . Constipation 07/08/2013  . Insomnia 02/13/2012  . Anxiety 02/13/2012  . 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 Hypothyroidism 03/08/2009   Kristy Jones PTA 01/03/2015 5:11 PM  Kristy Jones Name: Kristy Jones: [id number] of Birth: [birth year]   PHYSICAL THERAPY DISCHARGE SUMMARY  Visits from Start of Care: 8  Current functional level related to goals / functional outcomes: All goals met, see above for details   Remaining deficits: none   Education / Equipment: HEP Plan: Patient agrees to discharge.  Patient goals were met. Patient is being discharged due to meeting the stated rehab goals.  ?????      SJeral Pinch PT 01/04/2015  4:46 PM

## 2015-01-05 ENCOUNTER — Ambulatory Visit: Payer: Self-pay | Admitting: Family Medicine

## 2015-01-05 ENCOUNTER — Encounter: Payer: Self-pay | Admitting: Physical Therapy

## 2015-01-07 ENCOUNTER — Other Ambulatory Visit: Payer: Self-pay | Admitting: Physician Assistant

## 2015-01-10 ENCOUNTER — Encounter: Payer: Self-pay | Admitting: Family Medicine

## 2015-01-10 ENCOUNTER — Ambulatory Visit (INDEPENDENT_AMBULATORY_CARE_PROVIDER_SITE_OTHER): Payer: Worker's Compensation | Admitting: Family Medicine

## 2015-01-10 ENCOUNTER — Encounter: Payer: Self-pay | Admitting: Physical Therapy

## 2015-01-10 VITALS — BP 134/81 | HR 80 | Wt 232.0 lb

## 2015-01-10 DIAGNOSIS — M533 Sacrococcygeal disorders, not elsewhere classified: Secondary | ICD-10-CM

## 2015-01-10 NOTE — Assessment & Plan Note (Signed)
Much better following diagnostic and therapeutic injection. Continue watchful waiting return for repeat injection if it returns.

## 2015-01-10 NOTE — Progress Notes (Signed)
Kristy Jones is a 59 y.o. female who [facility]  today for follow-up SI joint pain.  Patient was seen about 3 weeks ago for SI joint pain. She received an injection after failing conservative therapy. She notes that she is much better now.   Past Medical History  Diagnosis Date  . Thyroid disease   . Lichen sclerosus    Past Surgical History  Procedure Laterality Date  . C-sections    . Septoplasty     Social History  Substance Use Topics  . Smoking status: Former Smoker    Types: Cigarettes    Quit date: 06/06/2007  . Smokeless tobacco: Not on file  . Alcohol Use: 3.0 oz/week    6 drink(s) per week   family history includes Alcohol abuse in an other family member; Depression in her mother; Prostate cancer in her father.  ROS as above Medications: Current Outpatient Prescriptions  Medication Sig Dispense Refill  . Adapalene 0.3 % gel APPLY TOPICALLY EVERY NIGHT AT BEDTIME 45 g 0  . AMBULATORY NON FORMULARY MEDICATION 1 tablet. Tumeric Take one- two  tablet daily.    . AMBULATORY NON FORMULARY MEDICATION Iodine    . ARMOUR THYROID 60 MG tablet TAKE 2 TABLETS BY MOUTH DAILY 60 tablet 2  . Ascorbic Acid (VITAMIN C) 1000 MG tablet Take 1,000 mg by mouth daily.    . Cholecalciferol (VITAMIN D-3 PO) Take 2,000 Units by mouth daily.     . clonazePAM (KLONOPIN) 0.5 MG tablet Take 1 tablet (0.5 mg total) by mouth 2 (two) times daily as needed. for anxiety Please schedule a f/u specifically for anxiety. 45 tablet 0  . cyclobenzaprine (FLEXERIL) 10 MG tablet Take 1 tablet (10 mg total) by mouth 3 (three) times daily as needed for muscle spasms. 90 tablet 1  . Diclofenac Sodium (PENNSAID) 2 % SOLN Apply twice daily to affected area 3 Bottle 1  . hydrochlorothiazide (HYDRODIURIL) 25 MG tablet TAKE 1 TABLET BY MOUTH EVERY DAY FOR PERIPHERAL EDEMA 30 tablet 0  . ibuprofen (ADVIL,MOTRIN) 800 MG tablet TAKE 1 TABLET BY MOUTH EVERY 8 HOURS AS  NEEDED 60 tablet 7  . Magnesium 500 MG CAPS Take by mouth. Take one in the am and one in the pm    . Omega-3 Fatty Acids (FISH OIL) 1000 MG CAPS Take by mouth.    . potassium gluconate 595 MG TABS tablet Take 595 mg by mouth daily.    . Selenium 100 MCG TABS Take 100 mcg by mouth daily.    Marland Kitchen. zinc gluconate 50 MG tablet Take 25 mg by mouth daily.      No current facility-administered medications for this visit.   Allergies  Allergen Reactions  . Vicodin [Hydrocodone-Acetaminophen]     Nausea     Exam:  BP 134/81 mmHg  Pulse 80  Wt 232 lb (105.235 kg) Gen: Well NAD   No results found for this or any previous visit (from the past 24 hour(s)). No results found.   Please see individual assessment and plan sections.

## 2015-01-12 ENCOUNTER — Encounter: Payer: Self-pay | Admitting: Physical Therapy

## 2015-01-16 ENCOUNTER — Encounter: Payer: Self-pay | Admitting: Rehabilitative and Restorative Service Providers"

## 2015-01-24 ENCOUNTER — Encounter: Payer: Self-pay | Admitting: Physical Therapy

## 2015-01-26 ENCOUNTER — Encounter: Payer: Self-pay | Admitting: Physical Therapy

## 2015-01-29 ENCOUNTER — Encounter: Payer: Self-pay | Admitting: Physical Therapy

## 2015-02-11 ENCOUNTER — Other Ambulatory Visit: Payer: Self-pay | Admitting: Physician Assistant

## 2015-02-13 ENCOUNTER — Other Ambulatory Visit: Payer: Self-pay | Admitting: Family Medicine

## 2015-02-14 ENCOUNTER — Other Ambulatory Visit: Payer: Self-pay | Admitting: Physician Assistant

## 2015-02-14 ENCOUNTER — Other Ambulatory Visit: Payer: Self-pay | Admitting: *Deleted

## 2015-02-14 MED ORDER — HYDROCHLOROTHIAZIDE 25 MG PO TABS: ORAL_TABLET | ORAL | Status: DC

## 2015-03-02 ENCOUNTER — Other Ambulatory Visit: Payer: Self-pay | Admitting: Family Medicine

## 2015-03-05 ENCOUNTER — Other Ambulatory Visit: Payer: Self-pay | Admitting: *Deleted

## 2015-03-05 MED ORDER — CLONAZEPAM 0.5 MG PO TABS: 0.5000 mg | ORAL_TABLET | Freq: Two times a day (BID) | ORAL | Status: DC | PRN

## 2015-04-10 ENCOUNTER — Other Ambulatory Visit: Payer: Self-pay | Admitting: Physician Assistant

## 2015-04-11 ENCOUNTER — Ambulatory Visit (INDEPENDENT_AMBULATORY_CARE_PROVIDER_SITE_OTHER): Payer: Federal, State, Local not specified - PPO | Admitting: Physician Assistant

## 2015-04-11 ENCOUNTER — Encounter: Payer: Self-pay | Admitting: Family Medicine

## 2015-04-11 ENCOUNTER — Encounter: Payer: Self-pay | Admitting: Physician Assistant

## 2015-04-11 ENCOUNTER — Ambulatory Visit (INDEPENDENT_AMBULATORY_CARE_PROVIDER_SITE_OTHER): Payer: Worker's Compensation | Admitting: Family Medicine

## 2015-04-11 VITALS — BP 130/92 | HR 98 | Wt 240.0 lb

## 2015-04-11 VITALS — BP 130/92 | HR 98 | Ht 68.0 in | Wt 240.0 lb

## 2015-04-11 DIAGNOSIS — Z6838 Body mass index (BMI) 38.0-38.9, adult: Secondary | ICD-10-CM | POA: Insufficient documentation

## 2015-04-11 DIAGNOSIS — T148XXA Other injury of unspecified body region, initial encounter: Secondary | ICD-10-CM | POA: Insufficient documentation

## 2015-04-11 DIAGNOSIS — F419 Anxiety disorder, unspecified: Secondary | ICD-10-CM

## 2015-04-11 DIAGNOSIS — D721 Eosinophilia, unspecified: Secondary | ICD-10-CM

## 2015-04-11 DIAGNOSIS — R600 Localized edema: Secondary | ICD-10-CM | POA: Diagnosis not present

## 2015-04-11 DIAGNOSIS — R635 Abnormal weight gain: Secondary | ICD-10-CM | POA: Insufficient documentation

## 2015-04-11 DIAGNOSIS — E669 Obesity, unspecified: Secondary | ICD-10-CM

## 2015-04-11 DIAGNOSIS — T148 Other injury of unspecified body region: Secondary | ICD-10-CM

## 2015-04-11 DIAGNOSIS — M533 Sacrococcygeal disorders, not elsewhere classified: Secondary | ICD-10-CM

## 2015-04-11 DIAGNOSIS — E039 Hypothyroidism, unspecified: Secondary | ICD-10-CM

## 2015-04-11 DIAGNOSIS — E6609 Other obesity due to excess calories: Secondary | ICD-10-CM | POA: Insufficient documentation

## 2015-04-11 MED ORDER — HYDROCHLOROTHIAZIDE 25 MG PO TABS: ORAL_TABLET | ORAL | Status: DC

## 2015-04-11 MED ORDER — LIRAGLUTIDE -WEIGHT MANAGEMENT 18 MG/3ML ~~LOC~~ SOPN: 0.6000 mg | PEN_INJECTOR | Freq: Every day | SUBCUTANEOUS | Status: DC

## 2015-04-11 MED ORDER — CLONAZEPAM 0.5 MG PO TABS: 0.5000 mg | ORAL_TABLET | Freq: Two times a day (BID) | ORAL | Status: DC | PRN

## 2015-04-11 MED ORDER — CYCLOBENZAPRINE HCL 10 MG PO TABS: 10.0000 mg | ORAL_TABLET | Freq: Three times a day (TID) | ORAL | Status: DC | PRN

## 2015-04-11 NOTE — Assessment & Plan Note (Signed)
Status post SI injection. Return as needed.

## 2015-04-11 NOTE — Patient Instructions (Signed)
Compression stocking can help with swelling.

## 2015-04-11 NOTE — Patient Instructions (Signed)
Thank you for coming in today. Call or go to the ER if you develop a large red swollen joint with extreme pain or oozing puss.  Return as needed.  Use flexeril as needed for pain.

## 2015-04-11 NOTE — Progress Notes (Signed)
Kristy Jones is a 60 y.o. female who presents to [facility] today for back and leg pain. Patient was seen in October for back hip and leg pain after failing conservative management. She had a diagnostic and therapeutic right SI injection. This worked remarkably well until recently. She has gradual return of pain. She has pain in her right low back right lateral leg and right lateral thigh. She denies any pain radiating below her knee. She denies any weakness or numbness bowel bladder dysfunction. She is interested in a repeat SI injection of possible.   Past Medical History  Diagnosis Date  . Thyroid disease   . Lichen sclerosus    Past Surgical History  Procedure Laterality Date  . C-sections    . Septoplasty     Social History  Substance Use Topics  . Smoking status: Former Smoker    Types: Cigarettes    Quit date: 06/06/2007  . Smokeless tobacco: Not on file  . Alcohol Use: 3.0 oz/week    6 drink(s) per week   family history includes Depression in her mother; Prostate cancer in her father.  ROS as above Medications: Current Outpatient Prescriptions  Medication Sig Dispense Refill  . Adapalene 0.3 % gel APPLY TOPICALLY EVERY NIGHT AT BEDTIME 45 g 0  . AMBULATORY NON FORMULARY MEDICATION 1 tablet. Tumeric Take one- two  tablet daily.    . AMBULATORY NON FORMULARY MEDICATION Iodine    . ARMOUR THYROID 60 MG tablet TAKE 2 TABLETS BY MOUTH DAILY 60 tablet 2  . Ascorbic Acid (VITAMIN C) 1000 MG tablet Take 1,000 mg by mouth daily.    Marland Kitchen b complex vitamins capsule Take 1 capsule by mouth daily.    Marland Kitchen BIOTIN PO Take by mouth.    . Cholecalciferol (VITAMIN D-3 PO) Take 2,000 Units by mouth daily.     . clonazePAM (KLONOPIN) 0.5 MG tablet Take 1 tablet (0.5 mg total) by mouth 2 (two) times daily as needed. for anxiety Please schedule a f/u specifically for anxiety. 45 tablet 0    . cyclobenzaprine (FLEXERIL) 10 MG tablet Take 1 tablet (10 mg total) by mouth 3 (three) times daily as needed for muscle spasms. 90 tablet 2  . hydrochlorothiazide (HYDRODIURIL) 25 MG tablet TAKE 1 TABLET BY MOUTH EVERY DAY FOR PERIPHERAL EDEMA 30 tablet 3  . ibuprofen (ADVIL,MOTRIN) 800 MG tablet TAKE 1 TABLET BY MOUTH EVERY 8 HOURS AS NEEDED 60 tablet 7  . L-LYSINE PO Take by mouth.    . Magnesium 500 MG CAPS Take by mouth. Take one in the am and one in the pm    . Omega-3 Fatty Acids (FISH OIL) 1000 MG CAPS Take by mouth.    . potassium gluconate 595 MG TABS tablet Take 595 mg by mouth daily.    . Selenium 100 MCG TABS Take 100 mcg by mouth daily.    Marland Kitchen zinc gluconate 50 MG tablet Take 25 mg by mouth daily.      No current facility-administered medications for this visit.   Allergies  Allergen Reactions  . Vicodin [Hydrocodone-Acetaminophen]     Nausea     Exam:  BP 130/92 mmHg  Pulse 98  Wt 240 lb (108.863 kg) Gen: Well NAD Back: Tender palpation right SI joint. Normal Gait. Lower extremity strength is intact throughout  Procedure: Real-time Ultrasound Guided Injection of right SI joint  Device: GE Logiq E  Images permanently stored and available for  review in the ultrasound unit. Verbal informed consent obtained. Discussed risks and benefits of procedure. Warned about infection bleeding damage to structures skin hypopigmentation and fat atrophy among others. Patient expresses understanding and agreement Time-out conducted.  Noted no overlying erythema, induration, or other signs of local infection.  Skin prepped in a sterile fashion.  Local anesthesia: Topical Ethyl chloride.  With sterile technique and under real time ultrasound guidance: 40 mg Kenalog and 4 mL of Marcaine injected easily.  Completed without difficulty  Pain partially resolved suggesting accurate placement of the medication.  Advised to call if fevers/chills, erythema, induration, drainage, or  persistent bleeding.  Images permanently stored and available for review in the ultrasound unit.  Impression: Technically successful ultrasound guided injection.    No results found for this or any previous visit (from the past 24 hour(s)). No results found.   Please see individual assessment and plan sections.   CC: PCP

## 2015-04-11 NOTE — Progress Notes (Addendum)
Subjective:    Patient ID: [facility], female    DOB: [birth year], 60 y.o.   MRN: [id number]  HPI  Patient is a 60 year old female that presents for follow up for hypothyroidism. Patient states that she has been taking Armour ( ) as directed. Patient states that she is tolerating medicine well and denies symptoms of fatigue, constipation, and hair loss. Additional concerns for this visit include obtaining a hydrochlorothiazide refill for edema of the right lower extremity, obtaining a refill on clonazepam for GAD/panic attacks, discussing easy bruising and discussing weight loss options. Patient states that she fell on her right knee two days ago and her ankle has been swollen since. Patient states that she would like her clonazepam refilled as she needs it occasionally  "to just get through the day" as she has frequent panic attacks due to her son being in prison. Patient states that she has noticed easy bruising for the past two years and is bothered by the appearance of the bruises. Patient states that she would like to lose weight in order to experience less sacroiliac pain. Patient would like to discuss weight loss medications. Patient states that she has otherwise been well.   Review of Systems Please see HPI.     Objective:   Physical Exam  Constitutional: She is oriented to person, place, and time. She appears well-developed and well-nourished.  HENT:  Head: Normocephalic and atraumatic.  Nose: Nose normal.  Mouth/Throat: Oropharynx is clear and moist.  Eyes: EOM are normal. Pupils are equal, round, and reactive to light.  Neck: Normal range of motion. No thyromegaly present.  Cardiovascular: Normal rate, regular rhythm, normal heart sounds and intact distal pulses.   No murmur heard. Pulmonary/Chest: Effort normal and breath sounds normal. No respiratory distress. She has no wheezes. She has no rales. She exhibits no tenderness.  Abdominal: Soft. She exhibits no distension.  There is no tenderness. There is no rebound and no guarding.  Musculoskeletal: Normal range of motion.  Patient has diffuse superficial bruising visualized along the right and left forearms.   Patient has 1+ pitting edema of the right ankle.   Lymphadenopathy:    She has no cervical adenopathy.  Neurological: She is alert and oriented to person, place, and time.  Skin: Skin is warm and dry.  Psychiatric: She has a normal mood and affect. Her behavior is normal. Judgment and thought content normal.      Assessment & Plan:   1. Hypothyroidism: Patient states that she has been taking Armour (60 mg) daily as directed. Patient denies symptoms of fatigue, constipation and hair loss. Patient will undergo testing to assess that TSH and free T3/T4 levels are within normal range.   2. Edema of the Right Lower Extremity:  Patient has 1+ pitting edema at the right ankle. A CMP was ordered for the patient to ensure that renal function is within normal range. Patient will continue with hydrochlorothiazide. Patient was advised to elevate right ankle and to use ice.    3. Obesity: Patient education was provided about diet and exercise. Patient made a goal to increase her daily exercise to 30 minutes a day by walking her dog twice a day and walking on the treadmill for 10 minutes a day. Patient states that she consumes foods that are rich in lean protein. However, she has a diet that is high in sugary beverages. Patient was advised to reduce sugary beverage consNavil Kolehalf. Patient was interested in learning more about  weight loss education. Patient education was provided regarding Contrave, Phentermine, and Saxenda. The risks and benefits were reviewed for each medication. Patient was receptive to trying [place].  4. Generalized anxiety Patient has excessive anxiety and panic attacks due to her current family dynamic. Patient was prescribed clonazepam. Patient education was provided regarding the abuse  potential of clonazepam.   5. Bruising  Patient has several areas of diffuse bruising along the right upper extremity. Patient is concerned about the appearance of bruises. A CBC and PT/aPTT was ordered. Patient's labs will be monitored. Discussed NSAIDs can cause this to worsen and potentially some of her natural remedies that it is unclear exactly what is in them.

## 2015-04-11 NOTE — Addendum Note (Signed)
Addended by: Jomarie Longs on: 04/11/2015 03:53 PM   Modules accepted: Level of Service

## 2015-04-12 LAB — CBC WITH DIFFERENTIAL/PLATELET
BASOS PCT: 1 % (ref 0–1)
Basophils Absolute: 0.1 10*3/uL (ref 0.0–0.1)
EOS PCT: 6 % — AB (ref 0–5)
Eosinophils Absolute: 0.5 10*3/uL (ref 0.0–0.7)
HEMATOCRIT: 43.7 % (ref 36.0–46.0)
Hemoglobin: 15 g/dL (ref 12.0–15.0)
LYMPHS PCT: 22 % (ref 12–46)
Lymphs Abs: 1.8 10*3/uL (ref 0.7–4.0)
MCH: 31.1 pg (ref 26.0–34.0)
MCHC: 34.3 g/dL (ref 30.0–36.0)
MCV: 90.7 fL (ref 78.0–100.0)
MONO ABS: 0.6 10*3/uL (ref 0.1–1.0)
MPV: 10.6 fL (ref 8.6–12.4)
Monocytes Relative: 7 % (ref 3–12)
Neutro Abs: 5.2 10*3/uL (ref 1.7–7.7)
Neutrophils Relative %: 64 % (ref 43–77)
Platelets: 292 10*3/uL (ref 150–400)
RBC: 4.82 MIL/uL (ref 3.87–5.11)
RDW: 13 % (ref 11.5–15.5)
WBC: 8.1 10*3/uL (ref 4.0–10.5)

## 2015-04-12 LAB — COMPLETE METABOLIC PANEL WITH GFR
ALK PHOS: 44 U/L (ref 33–130)
ALT: 16 U/L (ref 6–29)
AST: 21 U/L (ref 10–35)
Albumin: 3.5 g/dL — ABNORMAL LOW (ref 3.6–5.1)
BUN: 12 mg/dL (ref 7–25)
CALCIUM: 8.6 mg/dL (ref 8.6–10.4)
CO2: 25 mmol/L (ref 20–31)
Chloride: 109 mmol/L (ref 98–110)
Creat: 0.73 mg/dL (ref 0.50–1.05)
Glucose, Bld: 127 mg/dL — ABNORMAL HIGH (ref 65–99)
POTASSIUM: 4 mmol/L (ref 3.5–5.3)
Sodium: 140 mmol/L (ref 135–146)
Total Bilirubin: 0.2 mg/dL (ref 0.2–1.2)
Total Protein: 5.2 g/dL — ABNORMAL LOW (ref 6.1–8.1)

## 2015-04-12 LAB — TSH: TSH: 5.47 m[IU]/L — AB

## 2015-04-12 LAB — PROTIME-INR
INR: 0.92 (ref <1.50)
Prothrombin Time: 12.5 seconds (ref 11.6–15.2)

## 2015-04-12 LAB — T4, FREE: Free T4: 0.7 ng/dL — ABNORMAL LOW (ref 0.8–1.8)

## 2015-04-12 LAB — T3, FREE: T3 FREE: 3.8 pg/mL (ref 2.3–4.2)

## 2015-04-13 DIAGNOSIS — D721 Eosinophilia, unspecified: Secondary | ICD-10-CM | POA: Insufficient documentation

## 2015-04-13 MED ORDER — THYROID 30 MG PO TABS: ORAL_TABLET | ORAL | Status: DC

## 2015-04-13 NOTE — Addendum Note (Signed)
Addended by: Jomarie Longs on: 04/13/2015 08:20 AM   Modules accepted: Orders

## 2015-04-16 LAB — HM MAMMOGRAPHY

## 2015-04-20 ENCOUNTER — Telehealth: Payer: Self-pay | Admitting: *Deleted

## 2015-04-20 NOTE — Telephone Encounter (Signed)
Spoke with the pharmacy regarding saxenda.  Her ins won't cover it and it's $1222.  She wants to know what her other options are.

## 2015-04-23 NOTE — Telephone Encounter (Signed)
belviq is an option to try. But still could be pricey.  topamax is an option have you tried this.  Phentermine is the cheapest but you were concerned since could increase anxiety.

## 2015-04-26 ENCOUNTER — Encounter: Payer: Self-pay | Admitting: Physician Assistant

## 2015-05-09 ENCOUNTER — Other Ambulatory Visit: Payer: Self-pay | Admitting: Physician Assistant

## 2015-05-09 MED ORDER — TOPIRAMATE 50 MG PO TABS: ORAL_TABLET | ORAL | Status: DC

## 2015-05-09 NOTE — Telephone Encounter (Signed)
Sent to topamax to pharmacy. Follow up in 2  Months

## 2015-05-09 NOTE — Telephone Encounter (Signed)
Pt is okay with trying topamax since she's never tried anything before.  She wants to know when she needs to f/u with you after starting it.  Please advise.

## 2015-05-10 NOTE — Telephone Encounter (Signed)
Pt notified of rx. 

## 2015-05-13 ENCOUNTER — Other Ambulatory Visit: Payer: Self-pay | Admitting: Physician Assistant

## 2015-06-06 ENCOUNTER — Ambulatory Visit: Payer: Self-pay | Admitting: Physician Assistant

## 2015-06-12 ENCOUNTER — Telehealth: Payer: Self-pay | Admitting: *Deleted

## 2015-06-12 DIAGNOSIS — E039 Hypothyroidism, unspecified: Secondary | ICD-10-CM

## 2015-06-14 LAB — T3, FREE: T3, Free: 2.3 pg/mL (ref 2.3–4.2)

## 2015-06-14 LAB — TSH: TSH: 9.58 mIU/L — ABNORMAL HIGH

## 2015-06-14 LAB — T4, FREE: Free T4: 0.8 ng/dL (ref 0.8–1.8)

## 2015-06-14 NOTE — Telephone Encounter (Signed)
Labs ordered.

## 2015-07-12 ENCOUNTER — Other Ambulatory Visit: Payer: Self-pay | Admitting: *Deleted

## 2015-07-12 MED ORDER — THYROID 30 MG PO TABS: ORAL_TABLET | ORAL | Status: DC

## 2015-07-25 ENCOUNTER — Other Ambulatory Visit: Payer: Self-pay | Admitting: Physician Assistant

## 2015-07-30 ENCOUNTER — Telehealth: Payer: Self-pay | Admitting: *Deleted

## 2015-07-30 DIAGNOSIS — E039 Hypothyroidism, unspecified: Secondary | ICD-10-CM | POA: Diagnosis not present

## 2015-07-30 NOTE — Telephone Encounter (Signed)
Thyroid labs ordered

## 2015-07-31 DIAGNOSIS — L570 Actinic keratosis: Secondary | ICD-10-CM | POA: Diagnosis not present

## 2015-07-31 DIAGNOSIS — L814 Other melanin hyperpigmentation: Secondary | ICD-10-CM | POA: Diagnosis not present

## 2015-07-31 DIAGNOSIS — D225 Melanocytic nevi of trunk: Secondary | ICD-10-CM | POA: Diagnosis not present

## 2015-07-31 DIAGNOSIS — R609 Edema, unspecified: Secondary | ICD-10-CM | POA: Diagnosis not present

## 2015-08-01 LAB — THYROID PEROXIDASE ANTIBODY: THYROID PEROXIDASE ANTIBODY: 145 [IU]/mL — AB (ref <9)

## 2015-08-01 LAB — T3, FREE: T3, Free: 7 pg/mL — ABNORMAL HIGH (ref 2.3–4.2)

## 2015-08-01 LAB — TSH: TSH: 0.52 m[IU]/L

## 2015-08-01 LAB — T4, FREE: Free T4: 1.1 ng/dL (ref 0.8–1.8)

## 2015-08-02 ENCOUNTER — Other Ambulatory Visit: Payer: Self-pay | Admitting: *Deleted

## 2015-08-02 MED ORDER — THYROID 30 MG PO TABS: 120.0000 mg | ORAL_TABLET | Freq: Every day | ORAL | Status: DC

## 2015-08-06 ENCOUNTER — Telehealth: Payer: Self-pay | Admitting: *Deleted

## 2015-08-07 NOTE — Telephone Encounter (Signed)
Ok to change dose to 60mg  take 2 tablets daily #60 refills 6.

## 2015-08-07 NOTE — Telephone Encounter (Signed)
The armour thyroid 30 mg is not covered by insurance. The 60 mg dose is covered.

## 2015-08-08 ENCOUNTER — Other Ambulatory Visit: Payer: Self-pay | Admitting: *Deleted

## 2015-08-08 MED ORDER — THYROID 60 MG PO TABS: 120.0000 mg | ORAL_TABLET | Freq: Every day | ORAL | Status: DC

## 2015-08-08 NOTE — Telephone Encounter (Signed)
rx sent and called pt. Pt stated she picked up medication on Monday that was 60 mg bid. I asked her to make sure the rx was correct because it didn't look like we had recently sent that and the 30 mg dose was just sent. She was certain that it was 60 mg bid. If any concerns or problems she will let us know

## 2015-09-24 ENCOUNTER — Encounter: Payer: Self-pay | Admitting: Family Medicine

## 2015-09-24 ENCOUNTER — Ambulatory Visit (INDEPENDENT_AMBULATORY_CARE_PROVIDER_SITE_OTHER): Admitting: Family Medicine

## 2015-09-24 VITALS — BP 138/86 | HR 79 | Wt 250.0 lb

## 2015-09-24 DIAGNOSIS — M533 Sacrococcygeal disorders, not elsewhere classified: Secondary | ICD-10-CM | POA: Diagnosis not present

## 2015-09-24 NOTE — Patient Instructions (Signed)
Thank you for coming in today. Call or go to the ER if you develop a large red swollen joint with extreme pain or oozing puss.  Continue the exercises.  Return as needed.    

## 2015-09-24 NOTE — Progress Notes (Signed)
Kristy Jones is a 60 y.o. female who presents to [name] today for right SI joint pain patient has been seen several times in the past for right low back pain attributable to the SI joint. She's had injections previously and it worked well last a long time. She notes over the past few weeks she's had return of pain and right low back worse with standing and prolonged sitting. She denies any radiating pain weakness or numbness. She notes the pain is consistent with previous episodes of SI joint pain. She would like repeat Injection if possible..    Past Medical History:  Diagnosis Date  . Lichen sclerosus   . Thyroid disease    Past Surgical History:  Procedure Laterality Date  . c-sections    . SEPTOPLASTY     Social History  Substance Use Topics  . Smoking status: Former Smoker    Types: Cigarettes    Quit date: 06/06/2007  . Smokeless tobacco: Not on file  . Alcohol use 3.0 oz/week    6 drink(s) per week   family history includes Depression in her mother; Prostate cancer in her father.  ROS as above:  Medications: Current Outpatient Prescriptions  Medication Sig Dispense Refill  . Adapalene 0.3 % gel APPLY TOPICALLY EVERY NIGHT AT BEDTIME 45 g 0  . AMBULATORY NON FORMULARY MEDICATION 1 tablet. Tumeric Take one- two  tablet daily.    . AMBULATORY NON FORMULARY MEDICATION Iodine    . Ascorbic Acid (VITAMIN C) 1000 MG tablet Take 1,000 mg by mouth daily.    Marland Kitchen b complex vitamins capsule Take 1 capsule by mouth daily.    Marland Kitchen BIOTIN PO Take by mouth.    . Cholecalciferol (VITAMIN D-3 PO) Take 2,000 Units by mouth daily.     . clonazePAM (KLONOPIN) 0.5 MG tablet Take 1 tablet (0.5 mg total) by mouth 2 (two) times daily as needed. for anxiety 45 tablet 2  . cyclobenzaprine (FLEXERIL) 10 MG tablet Take 1 tablet (10 mg total) by mouth 3 (three) times daily as  needed for muscle spasms. 90 tablet 2  . hydrochlorothiazide (HYDRODIURIL) 25 MG tablet TAKE 1 TABLET BY MOUTH EVERY DAY FOR PERIPHERAL EDEMA 30 tablet 5  . ibuprofen (ADVIL,MOTRIN) 800 MG tablet TAKE 1 TABLET BY MOUTH EVERY 8 HOURS AS NEEDED 60 tablet 0  . L-LYSINE PO Take by mouth.    . Magnesium 500 MG CAPS Take by mouth. Take one in the am and one in the pm    . Omega-3 Fatty Acids (FISH OIL) 1000 MG CAPS Take by mouth.    . potassium gluconate 595 MG TABS tablet Take 595 mg by mouth daily.    . Selenium 100 MCG TABS Take 100 mcg by mouth daily.    Marland Kitchen thyroid (ARMOUR THYROID) 60 MG tablet Take 2 tablets (120 mg total) by mouth daily. 60 tablet 6  . zinc gluconate 50 MG tablet Take 25 mg by mouth daily.      No current facility-administered medications for this visit.    Allergies  Allergen Reactions  . Vicodin [Hydrocodone-Acetaminophen]     Nausea     Exam:  BP 138/86   Pulse 79   Wt 250 lb (113.4 kg)   BMI 38.01 kg/m  Gen: Well NAD MSK: Tender to palpation right SI joint. Nontender spinal midline. Normal low back motion. Normal gait.   Procedure: Real-time Ultrasound Guided Injection of right  SI joint  Device: GE Logiq E  Images permanently stored and available for review in the ultrasound unit. Verbal informed consent obtained. Discussed risks and benefits of procedure. Warned about infection bleeding damage to structures skin hypopigmentation and fat atrophy among others. Patient expresses understanding and agreement Time-out conducted.  Noted no overlying erythema, induration, or other signs of local infection.  Skin prepped in a sterile fashion.  Local anesthesia: Topical Ethyl chloride.  With sterile technique and under real time ultrasound guidance: 80 mg Kenalog and 4 mL of Marcaine injected easily.  Completed without difficulty  Pain partially resolved suggesting accurate placement of the medication.  Advised to call if fevers/chills, erythema,  induration, drainage, or persistent bleeding.  Images permanently stored and available for review in the ultrasound unit.  Impression: Technically successful ultrasound guided injection.  Marcaine lot number [phone] Kenalog lot number [id number]  No results found for this or any previous visit (from the past 24 hour(s)). No results found.    Assessment and Plan: 60 y.o. female with right SI joint pain. Status post injection. Continue home exercise program return as needed. Establish problem worsend.    No orders of the defined types were placed in this encounter.   Discussed warning signs or symptoms. Please see discharge instructions. Patient expresses understanding.

## 2015-09-26 ENCOUNTER — Other Ambulatory Visit: Payer: Self-pay | Admitting: Physician Assistant

## 2015-10-21 ENCOUNTER — Other Ambulatory Visit: Payer: Self-pay | Admitting: Physician Assistant

## 2015-10-23 DIAGNOSIS — B356 Tinea cruris: Secondary | ICD-10-CM | POA: Diagnosis not present

## 2015-10-23 DIAGNOSIS — L900 Lichen sclerosus et atrophicus: Secondary | ICD-10-CM | POA: Diagnosis not present

## 2015-10-23 DIAGNOSIS — R300 Dysuria: Secondary | ICD-10-CM | POA: Diagnosis not present

## 2015-10-23 DIAGNOSIS — K080 Exfoliation of teeth due to systemic causes: Secondary | ICD-10-CM | POA: Diagnosis not present

## 2015-11-02 ENCOUNTER — Other Ambulatory Visit: Payer: Self-pay | Admitting: Physician Assistant

## 2015-11-04 ENCOUNTER — Other Ambulatory Visit: Payer: Self-pay | Admitting: Physician Assistant

## 2015-11-28 ENCOUNTER — Other Ambulatory Visit: Payer: Self-pay | Admitting: *Deleted

## 2015-11-28 ENCOUNTER — Other Ambulatory Visit: Payer: Self-pay | Admitting: Physician Assistant

## 2015-11-30 ENCOUNTER — Other Ambulatory Visit: Payer: Self-pay | Admitting: *Deleted

## 2015-11-30 MED ORDER — TRAZODONE HCL 50 MG PO TABS: 50.0000 mg | ORAL_TABLET | Freq: Every day | ORAL | 2 refills | Status: DC

## 2015-12-14 ENCOUNTER — Other Ambulatory Visit: Payer: Self-pay | Admitting: Physician Assistant

## 2015-12-18 DIAGNOSIS — L820 Inflamed seborrheic keratosis: Secondary | ICD-10-CM | POA: Diagnosis not present

## 2015-12-18 DIAGNOSIS — L814 Other melanin hyperpigmentation: Secondary | ICD-10-CM | POA: Diagnosis not present

## 2015-12-18 DIAGNOSIS — D225 Melanocytic nevi of trunk: Secondary | ICD-10-CM | POA: Diagnosis not present

## 2015-12-18 DIAGNOSIS — L579 Skin changes due to chronic exposure to nonionizing radiation, unspecified: Secondary | ICD-10-CM | POA: Diagnosis not present

## 2016-01-09 ENCOUNTER — Other Ambulatory Visit: Payer: Self-pay | Admitting: Family Medicine

## 2016-01-10 ENCOUNTER — Other Ambulatory Visit: Payer: Self-pay | Admitting: Physician Assistant

## 2016-01-22 ENCOUNTER — Telehealth: Payer: Self-pay | Admitting: *Deleted

## 2016-01-22 DIAGNOSIS — E039 Hypothyroidism, unspecified: Secondary | ICD-10-CM

## 2016-01-22 NOTE — Telephone Encounter (Signed)
Labs ordered to recheck thyroid. 

## 2016-01-25 DIAGNOSIS — E039 Hypothyroidism, unspecified: Secondary | ICD-10-CM | POA: Diagnosis not present

## 2016-01-26 ENCOUNTER — Other Ambulatory Visit: Payer: Self-pay | Admitting: Physician Assistant

## 2016-01-26 LAB — TSH: TSH: 0.02 mIU/L — ABNORMAL LOW

## 2016-01-26 LAB — T4, FREE: Free T4: 1.2 ng/dL (ref 0.8–1.8)

## 2016-01-26 LAB — T3, FREE: T3 FREE: 4.2 pg/mL (ref 2.3–4.2)

## 2016-01-29 NOTE — Telephone Encounter (Signed)
Alternate one tablet then 2 tablets recheck labs only 6 weeks.

## 2016-01-30 ENCOUNTER — Emergency Department
Admission: EM | Admit: 2016-01-30 | Discharge: 2016-01-30 | Disposition: A | Discharge status: Home / Self Care | Payer: Federal, State, Local not specified - PPO | Source: Home / Self Care | Attending: Family Medicine | Admitting: Family Medicine

## 2016-01-30 DIAGNOSIS — L03116 Cellulitis of left lower limb: Secondary | ICD-10-CM | POA: Diagnosis not present

## 2016-01-30 MED ORDER — CEPHALEXIN 500 MG PO CAPS: 500.0000 mg | ORAL_CAPSULE | Freq: Two times a day (BID) | ORAL | 0 refills | Status: DC

## 2016-01-30 NOTE — ED Triage Notes (Signed)
Patient has a small sore on her lower left leg. She states she bumped into something at work last week. She states it has become red, swollen and painful and feels warm to touch. She states that it is a constant dull ache.

## 2016-01-30 NOTE — Discharge Instructions (Signed)
°  You may take over the counter acetaminophen and/or ibuprofen as needed for pain.  Try to keep wound clean with warm soap and water.  You may use over the counter bacitracin, polysporin, or neosporin for 5 days.  No need to use alcohol or iodine anymore as that can harm new healthy tissue at this point but those are good for initial injury/wound cleaning.

## 2016-01-30 NOTE — ED Provider Notes (Signed)
CSN: [id number]     Arrival date & time 01/30/16  1720 History   First MD Initiated Contact with Patient 01/30/16 1751     Chief Complaint  Patient presents with  . Leg Injury    Left lower   (Consider location/radiation/quality/duration/timing/severity/associated sxs/prior Treatment) HPI  Kristy Jones is a 60 y.o. female presenting to UC with c/o Left lower leg redness and soreness after bumping it into something at work last week and again a few days ago on accident.  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  She has been trying iodine, alcohol, and bacitracin to help keep wound clean.  She reports hx of cellulitis and she hopes to prevent sore from getting worse and worse. Denies fever, chills, n/v/d. Denies hx of DM.   Past Medical History:  Diagnosis Date  . Lichen sclerosus   . Thyroid disease    Past Surgical History:  Procedure Laterality Date  . c-sections    . SEPTOPLASTY     Family History  Problem Relation Age of Onset  . Alcohol abuse      grandfather  . Prostate cancer Father   . Depression Mother     sister   Social History  Substance Use Topics  . Smoking status: Former Smoker    Types: Cigarettes    Quit date: 06/06/2007  . Smokeless tobacco: Never Used  . Alcohol use 3.0 oz/week    6 Standard drinks or equivalent per week   OB History    No data available     Review of Systems  Constitutional: Negative for chills and fever.  Musculoskeletal: Negative for arthralgias, joint swelling and myalgias.  Skin: Positive for color change and wound.       Left lower leg    Allergies  Tetracyclines & related and Vicodin [hydrocodone-acetaminophen]  Home Medications   Prior to Admission medications   Medication Sig Start Date End Date Taking? Authorizing Provider  Adapalene 0.3 % gel APPLY TOPICALLY EVERY NIGHT AT BEDTIME 06/05/14  Yes Jade L Breeback, PA-C  AMBULATORY NON FORMULARY  MEDICATION 1 tablet. Tumeric Take one- two  tablet daily.   Yes Historical Provider, MD  AMBULATORY NON FORMULARY MEDICATION Iodine   Yes Historical Provider, MD  ARMOUR THYROID 60 MG tablet TAKE 2 TABLETS(120 MG) BY MOUTH DAILY BEFORE BREAKFAST 01/29/16  Yes Jade L Breeback, PA-C  Ascorbic Acid (VITAMIN C) 1000 MG tablet Take 1,000 mg by mouth daily.   Yes Historical Provider, MD  b complex vitamins capsule Take 1 capsule by mouth daily.   Yes Historical Provider, MD  BIOTIN PO Take by mouth.   Yes Historical Provider, MD  Cholecalciferol (VITAMIN D-3 PO) Take 2,000 Units by mouth daily.    Yes Historical Provider, MD  clonazePAM (KLONOPIN) 0.5 MG tablet TAKE 1 TABLET BY MOUTH TWICE DAILY AS NEEDED FOR ANXIETY 01/10/16  Yes Jade L Breeback, PA-C  cyclobenzaprine (FLEXERIL) 10 MG tablet Take 1 tablet (10 mg total) by mouth 3 (three) times daily as needed for muscle spasms. 04/11/15  Yes Rodolph BongEvan S Corey, MD  hydrochlorothiazide (HYDRODIURIL) 25 MG tablet TAKE 1 TABLET BY MOUTH EVERY DAY FOR PERIPHERAL EDEMA 04/11/15  Yes Jade L Breeback, PA-C  ibuprofen (ADVIL,MOTRIN) 800 MG tablet TAKE 1 TABLET BY MOUTH EVERY 8 HOURS AS NEEDED 01/10/16  Yes Jade L Breeback, PA-C  L-LYSINE PO Take by mouth.   Yes Historical Provider, MD  Magnesium 500 MG CAPS Take by mouth. Take one in the am and one in the pm   Yes Historical Provider, MD  Omega-3 Fatty Acids (FISH OIL) 1000 MG CAPS Take by mouth.   Yes Historical Provider, MD  potassium gluconate 595 MG TABS tablet Take 595 mg by mouth daily.   Yes Historical Provider, MD  Selenium 100 MCG TABS Take 100 mcg by mouth daily.   Yes Historical Provider, MD  thyroid (ARMOUR THYROID) 60 MG tablet Take 2 tablets (120 mg total) by mouth daily. 08/08/15  Yes Jade L Breeback, PA-C  zinc gluconate 50 MG tablet Take 25 mg by mouth daily.    Yes Historical Provider, MD  cephALEXin (KEFLEX) 500 MG capsule Take 1 capsule (500 mg total) by mouth 2 (two) times daily. For 7 days 01/30/16    Junius FinnerErin O'Malley, PA-C  hydrochlorothiazide (HYDRODIURIL) 25 MG tablet TAKE 1 TABLET BY MOUTH EVERY DAY FOR PERIPHERAL EDEMA 11/02/15   Jomarie LongsJade L Breeback, PA-C  traZODone (DESYREL) 50 MG tablet Take 1 tablet (50 mg total) by mouth at bedtime. 11/30/15 12/30/15  Jomarie LongsJade L Breeback, PA-C   Meds Ordered and Administered this Visit  Medications - No data to display  BP 127/82 (BP Location: Left Arm)   Pulse 72   Temp 98.1 F (36.7 C) (Oral)   Ht 5\' 7"  (1.702 m)   Wt 239 lb (108.4 kg)   SpO2 97%   BMI 37.43 kg/m  No data found.   Physical Exam  Constitutional: She is oriented to person, place, and time. She appears well-developed and well-nourished.  HENT:  Head: Normocephalic and atraumatic.  Eyes: EOM are normal.  Neck: Normal range of motion.  Cardiovascular: Normal rate.   Pulmonary/Chest: Effort normal.  Musculoskeletal: Normal range of motion. She exhibits tenderness. She exhibits no edema.  Left lower leg: no edema. Mild tenderness to anterior aspect (see skin exam). Calf is soft, non-tender.  Neurological: She is alert and oriented to person, place, and time.  Skin: Skin is warm and dry. Capillary refill takes less than 2 seconds. There is erythema.  Left lower anterior leg: 0.5cm superficial abrasion with 0.5cm area of surrounding erythema. Tender to touch. No fluctuance. No bleeding or discharge.   Psychiatric: She has a normal mood and affect. Her behavior is normal.  Nursing note and vitals reviewed.   Urgent Care Course   Clinical Course     Procedures (including critical care time)  Labs Review Labs Reviewed - No data to display  Imaging Review No results found.    MDM   1. Cellulitis of left lower extremity    Pt c/o Left lower leg pain and redness, concern for cellulitis as she has had that in the past.    Exam c/w mild soft tissue skin infection of Left lower leg. However, due to hx of cellulitis and possible failed home treatment with bacitracin, will start  pt on PO antibiotic- keflex. Home care instructions provided. F/u with PCP in 1 week if not improving, sooner if significantly worsening.    Junius FinnerErin O'Malley, PA-C 01/31/16 [phone]

## 2016-02-03 NOTE — Telephone Encounter (Signed)
Patient states her leg is healing.

## 2016-02-07 ENCOUNTER — Other Ambulatory Visit: Payer: Self-pay | Admitting: Physician Assistant

## 2016-02-19 ENCOUNTER — Telehealth: Payer: Self-pay | Admitting: Physician Assistant

## 2016-02-19 NOTE — Telephone Encounter (Signed)
Letter written, Pt advised.  

## 2016-02-19 NOTE — Telephone Encounter (Signed)
Pt called clinic today requesting work note. 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 Pt reports her job gives her the option to get overtime or not, she opted for no overtime. Pt states she got a Writer[employer] manager who is not respecting that.   Pt would like a note stating due to her medical conditions (she has been seen many times for her hip), she can not work longer than 8 hour days. Will route to PCP for review.

## 2016-02-19 NOTE — Telephone Encounter (Signed)
Ok for note that due to medical conditions such as recurrent greater trochanter bursitis that patient cannot work longer than 8 hours a day.

## 2016-02-20 ENCOUNTER — Other Ambulatory Visit: Payer: Self-pay | Admitting: Physician Assistant

## 2016-02-23 ENCOUNTER — Other Ambulatory Visit: Payer: Self-pay | Admitting: Physician Assistant

## 2016-02-23 DIAGNOSIS — J019 Acute sinusitis, unspecified: Secondary | ICD-10-CM | POA: Diagnosis not present

## 2016-03-10 ENCOUNTER — Telehealth: Payer: Self-pay | Admitting: *Deleted

## 2016-03-10 DIAGNOSIS — E039 Hypothyroidism, unspecified: Secondary | ICD-10-CM

## 2016-03-10 NOTE — Telephone Encounter (Signed)
Labs ordered.

## 2016-03-11 LAB — T4, FREE: FREE T4: 1 ng/dL (ref 0.8–1.8)

## 2016-03-11 LAB — TSH: TSH: 0.16 m[IU]/L — AB

## 2016-03-11 LAB — T3, FREE: T3 FREE: 4.8 pg/mL — AB (ref 2.3–4.2)

## 2016-03-14 ENCOUNTER — Other Ambulatory Visit: Payer: Self-pay | Admitting: *Deleted

## 2016-03-14 ENCOUNTER — Other Ambulatory Visit: Payer: Self-pay | Admitting: Physician Assistant

## 2016-03-14 MED ORDER — THYROID 90 MG PO TABS: 90.0000 mg | ORAL_TABLET | Freq: Every day | ORAL | 1 refills | Status: DC

## 2016-03-14 MED ORDER — THYROID 15 MG PO TABS: 15.0000 mg | ORAL_TABLET | Freq: Every day | ORAL | 1 refills | Status: DC

## 2016-03-24 ENCOUNTER — Other Ambulatory Visit: Payer: Self-pay | Admitting: Physician Assistant

## 2016-04-22 DIAGNOSIS — Z6837 Body mass index (BMI) 37.0-37.9, adult: Secondary | ICD-10-CM | POA: Diagnosis not present

## 2016-04-22 DIAGNOSIS — Z01419 Encounter for gynecological examination (general) (routine) without abnormal findings: Secondary | ICD-10-CM | POA: Diagnosis not present

## 2016-04-22 LAB — HM PAP SMEAR: HM PAP: NEGATIVE

## 2016-05-07 DIAGNOSIS — K080 Exfoliation of teeth due to systemic causes: Secondary | ICD-10-CM | POA: Diagnosis not present

## 2016-05-08 ENCOUNTER — Other Ambulatory Visit: Payer: Self-pay | Admitting: Physician Assistant

## 2016-05-10 ENCOUNTER — Other Ambulatory Visit: Payer: Self-pay | Admitting: Physician Assistant

## 2016-05-12 ENCOUNTER — Other Ambulatory Visit: Payer: Self-pay | Admitting: Physician Assistant

## 2016-05-13 ENCOUNTER — Other Ambulatory Visit: Payer: Self-pay | Admitting: *Deleted

## 2016-05-13 MED ORDER — IBUPROFEN 800 MG PO TABS: 800.0000 mg | ORAL_TABLET | Freq: Three times a day (TID) | ORAL | 1 refills | Status: DC | PRN

## 2016-05-26 DIAGNOSIS — Z1231 Encounter for screening mammogram for malignant neoplasm of breast: Secondary | ICD-10-CM | POA: Diagnosis not present

## 2016-05-26 LAB — HM MAMMOGRAPHY

## 2016-05-27 ENCOUNTER — Other Ambulatory Visit: Payer: Self-pay | Admitting: Physician Assistant

## 2016-05-27 ENCOUNTER — Encounter: Payer: Self-pay | Admitting: Physician Assistant

## 2016-05-29 ENCOUNTER — Other Ambulatory Visit: Payer: Self-pay | Admitting: *Deleted

## 2016-06-03 ENCOUNTER — Ambulatory Visit: Payer: Self-pay | Admitting: Physician Assistant

## 2016-06-12 ENCOUNTER — Other Ambulatory Visit: Payer: Self-pay | Admitting: Physician Assistant

## 2016-06-16 ENCOUNTER — Other Ambulatory Visit: Payer: Self-pay | Admitting: Physician Assistant

## 2016-07-01 ENCOUNTER — Other Ambulatory Visit: Payer: Self-pay | Admitting: Physician Assistant

## 2016-07-02 ENCOUNTER — Other Ambulatory Visit: Payer: Self-pay | Admitting: Physician Assistant

## 2016-07-10 ENCOUNTER — Telehealth: Payer: Self-pay | Admitting: *Deleted

## 2016-07-10 DIAGNOSIS — E039 Hypothyroidism, unspecified: Secondary | ICD-10-CM

## 2016-07-10 NOTE — Telephone Encounter (Signed)
Labs ordered to recheck thyroid per pt request.

## 2016-07-12 LAB — T3, FREE: T3 FREE: 5.3 pg/mL — AB (ref 2.3–4.2)

## 2016-07-12 LAB — T4, FREE: FREE T4: 0.9 ng/dL (ref 0.8–1.8)

## 2016-07-12 LAB — TSH: TSH: 0.23 m[IU]/L — AB

## 2016-07-15 ENCOUNTER — Ambulatory Visit (INDEPENDENT_AMBULATORY_CARE_PROVIDER_SITE_OTHER): Payer: Federal, State, Local not specified - PPO | Admitting: Physician Assistant

## 2016-07-15 ENCOUNTER — Encounter: Payer: Self-pay | Admitting: Physician Assistant

## 2016-07-15 VITALS — BP 120/79 | HR 80 | Ht 67.0 in | Wt 240.0 lb

## 2016-07-15 DIAGNOSIS — R0602 Shortness of breath: Secondary | ICD-10-CM

## 2016-07-15 DIAGNOSIS — F419 Anxiety disorder, unspecified: Secondary | ICD-10-CM | POA: Diagnosis not present

## 2016-07-15 DIAGNOSIS — E039 Hypothyroidism, unspecified: Secondary | ICD-10-CM

## 2016-07-15 DIAGNOSIS — F5101 Primary insomnia: Secondary | ICD-10-CM | POA: Diagnosis not present

## 2016-07-15 DIAGNOSIS — R531 Weakness: Secondary | ICD-10-CM | POA: Diagnosis not present

## 2016-07-15 DIAGNOSIS — R600 Localized edema: Secondary | ICD-10-CM

## 2016-07-15 MED ORDER — HYDROCHLOROTHIAZIDE 25 MG PO TABS: 25.0000 mg | ORAL_TABLET | Freq: Every day | ORAL | 5 refills | Status: DC

## 2016-07-15 MED ORDER — CLONAZEPAM 0.5 MG PO TABS: 0.5000 mg | ORAL_TABLET | Freq: Two times a day (BID) | ORAL | 5 refills | Status: DC | PRN

## 2016-07-15 MED ORDER — TRAZODONE HCL 50 MG PO TABS: ORAL_TABLET | ORAL | 5 refills | Status: DC

## 2016-07-15 MED ORDER — THYROID 90 MG PO TABS: ORAL_TABLET | ORAL | 2 refills | Status: DC

## 2016-07-15 MED ORDER — THYROID 15 MG PO TABS: ORAL_TABLET | ORAL | 2 refills | Status: DC

## 2016-07-15 NOTE — Progress Notes (Signed)
Subjective:    Patient ID: Kristy Jones, female    DOB: 06/12/1955, 61 y.o.   MRN: [id number]  HPI  Pt is a 61 yo female who presents to the clinic to follow up on hypothyroidism. She is doing ok. She felt better on T100 before they discontinued it. She is taking armour thyroid. Will review labs today. She continues to feel tired and gaining weight.   she needs refills on HCTZ for lower extremity edema. She is taking daily for swelling. She is concerned because she is having some SOB and weakness as well with any physical exertion. Her dad had bypass at 6165. She denies any CP, palpitations, left arm or jaw pain.   She takes trazodone as needed for insomnia. She has no problems or concerns.   .. Active Ambulatory Problems    Diagnosis Date Noted  . Hypothyroidism 03/08/2009  . 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 Insomnia 02/13/2012  . Anxiety 02/13/2012  . Constipation 07/08/2013  . Hip bursitis 06/14/2014  . 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 DDD (degenerative disc disease), lumbar 06/14/2014  . Right-sided low back pain without sciatica 06/18/2014  . Piriformis syndrome 09/12/2014  . Muscle cramps 11/09/2014  . Edema of right lower extremity 11/09/2014  . Bruising 04/11/2015  . Obesity 04/11/2015  . Abnormal weight gain 04/11/2015  . Eosinophilia 04/13/2015  . SOB (shortness of breath) 07/16/2016  . Bilateral lower extremity edema 07/16/2016  . Weakness 07/16/2016   Resolved Ambulatory Problems    Diagnosis Date Noted  . HAND PAIN, LEFT 03/08/2009  . Acute sinusitis, unspecified 08/12/2010   Past Medical History:  Diagnosis Date  . Lichen sclerosus   . Thyroid disease     Review of Systems See HPI.     Objective:   Physical Exam  Constitutional: She is oriented to person, place, and time. She appears well-developed and well-nourished.  Neck: Normal range of motion. Neck supple.  Cardiovascular: Normal rate, regular rhythm and normal  heart sounds.   Pulmonary/Chest: Effort normal and breath sounds normal. She has no wheezes.  Lymphadenopathy:    She has no cervical adenopathy.  Neurological: She is alert and oriented to person, place, and time.  Psychiatric: She has a normal mood and affect. Her behavior is normal.          Assessment & Plan:  Marland Kitchen.Marland Kitchen.Gunnar Fusiaula was seen today for hypothyroidism.  Diagnoses and all orders for this visit:  Hypothyroidism, unspecified type -     thyroid (ARMOUR THYROID) 90 MG tablet; TAKE 1 TABLET(90 MG) BY MOUTH DAILY -     thyroid (ARMOUR THYROID) 15 MG tablet; TAKE 1 TABLET(15 MG) BY MOUTH DAILY  Edema of right lower extremity -     hydrochlorothiazide (HYDRODIURIL) 25 MG tablet; Take 1 tablet (25 mg total) by mouth daily. FOR PERIPHERAL EDEMA -     Exercise Tolerance Test; Future -     ECHOCARDIOGRAM COMPLETE; Future -     Exercise Tolerance Test -     ECHOCARDIOGRAM COMPLETE  SOB (shortness of breath) -     Exercise Tolerance Test; Future -     ECHOCARDIOGRAM COMPLETE; Future -     Exercise Tolerance Test -     ECHOCARDIOGRAM COMPLETE  Bilateral lower extremity edema -     Exercise Tolerance Test; Future -     ECHOCARDIOGRAM COMPLETE; Future -     Exercise Tolerance Test -     ECHOCARDIOGRAM COMPLETE  Weakness -  Exercise Tolerance Test; Future -     ECHOCARDIOGRAM COMPLETE; Future -     Exercise Tolerance Test -     ECHOCARDIOGRAM COMPLETE  Primary insomnia -     traZODone (DESYREL) 50 MG tablet; TAKE 1 TABLET(50 MG) BY MOUTH AT BEDTIME  Anxiety  Other orders -     clonazePAM (KLONOPIN) 0.5 MG tablet; Take 1 tablet (0.5 mg total) by mouth 2 (two) times daily as needed. for anxiety   TSH is on low side but T3 elevated and T4 on the low side. I do not think patient is converted medication to T4. Stop armour. Consider switching to levothyroxine.  Ok to start natural supplement. Ok to start in 4 weeks.   Due to weakness/SOB/family hx will check heart with  echo and stress test. I suspect poor exercise tolerate could be delmina.

## 2016-07-15 NOTE — Patient Instructions (Signed)
Order stress test and echo.

## 2016-07-16 ENCOUNTER — Encounter: Payer: Self-pay | Admitting: Physician Assistant

## 2016-07-16 DIAGNOSIS — R0602 Shortness of breath: Secondary | ICD-10-CM | POA: Insufficient documentation

## 2016-07-16 DIAGNOSIS — R0609 Other forms of dyspnea: Secondary | ICD-10-CM | POA: Insufficient documentation

## 2016-07-16 DIAGNOSIS — R531 Weakness: Secondary | ICD-10-CM | POA: Insufficient documentation

## 2016-07-16 DIAGNOSIS — R600 Localized edema: Secondary | ICD-10-CM | POA: Insufficient documentation

## 2016-07-17 NOTE — Telephone Encounter (Signed)
Call pt: reviewed labs with Dr. Linford ArnoldMetheney. Since your T3 levels are elevated and TSH levels depressed. Lets stop the 15mcg armour and continue on 90. Continue/Start OTC supplement. Recheck labs in 4 weeks. I do think you should consider levothryoxine which likely would help your T4 levels. Armour is a little harder to increase to T4 if body not converting.

## 2016-07-22 ENCOUNTER — Other Ambulatory Visit: Payer: Self-pay | Admitting: Physician Assistant

## 2016-07-23 ENCOUNTER — Ambulatory Visit (INDEPENDENT_AMBULATORY_CARE_PROVIDER_SITE_OTHER): Admitting: Family Medicine

## 2016-07-23 VITALS — BP 124/77 | HR 70 | Temp 97.8°F | Wt 242.0 lb

## 2016-07-23 DIAGNOSIS — M533 Sacrococcygeal disorders, not elsewhere classified: Secondary | ICD-10-CM

## 2016-07-23 NOTE — Telephone Encounter (Signed)
Pt notified of instructions

## 2016-07-23 NOTE — Progress Notes (Signed)
Pt having lower right back pain

## 2016-07-23 NOTE — Progress Notes (Signed)
Kristy Jones is a 61 y.o. female who presents to [facility] today for right low back pain. Patient returns to clinic today with recurrent right low back pain. She's been seen several times for pain in the right low back.to be due to SI joint pain. She's had serial injections which typically last more than 6 months. She last received an injection 09/24/2015. This injection provided lasting pain relief until a month or 2 ago. She would like a repeat injection of possible. 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   Past Medical History:  Diagnosis Date  . Lichen sclerosus   . Thyroid disease    Past Surgical History:  Procedure Laterality Date  . c-sections    . SEPTOPLASTY     Social History  Substance Use Topics  . Smoking status: Former Smoker    Types: Cigarettes    Quit date: 06/06/2007  . Smokeless tobacco: Never Used  . Alcohol use 3.0 oz/week    6 Standard drinks or equivalent per week     ROS:  As above   Medications: Current Outpatient Prescriptions  Medication Sig Dispense Refill  . Adapalene 0.3 % gel APPLY TOPICALLY EVERY NIGHT AT BEDTIME 45 g 0  . AMBULATORY NON FORMULARY MEDICATION Take 600 mg by mouth daily. Tumeric Take one- two  tablet daily.     . AMBULATORY NON FORMULARY MEDICATION Iodine    . Ascorbic Acid (VITAMIN C) 1000 MG tablet Take 1,000 mg by mouth daily.    Marland Kitchen b complex vitamins capsule Take 1 capsule by mouth daily.    Marland Kitchen BIOTIN PO Take by mouth.    . Cholecalciferol (VITAMIN D-3 PO) Take 2,000 Units by mouth daily.     . clonazePAM (KLONOPIN) 0.5 MG tablet Take 1 tablet (0.5 mg total) by mouth 2 (two) times daily as needed. for anxiety 30 tablet 5  . hydrochlorothiazide (HYDRODIURIL) 25 MG tablet Take 1 tablet (25 mg total) by mouth daily. FOR PERIPHERAL EDEMA 30 tablet 5  . ibuprofen (ADVIL,MOTRIN) 800 MG tablet Take 1 tablet (800 mg total) by mouth every 8 (eight) hours as  needed. 60 tablet 1  . L-LYSINE PO Take by mouth.    . Magnesium 500 MG CAPS Take by mouth. Take one in the am and one in the pm    . Omega-3 Fatty Acids (FISH OIL) 1000 MG CAPS Take by mouth.    . Selenium 100 MCG TABS Take 100 mcg by mouth daily.    Marland Kitchen thyroid (ARMOUR THYROID) 15 MG tablet TAKE 1 TABLET(15 MG) BY MOUTH DAILY 30 tablet 2  . thyroid (ARMOUR THYROID) 90 MG tablet TAKE 1 TABLET(90 MG) BY MOUTH DAILY 30 tablet 2  . traZODone (DESYREL) 50 MG tablet TAKE 1 TABLET(50 MG) BY MOUTH AT BEDTIME 30 tablet 5  . zinc gluconate 50 MG tablet Take 25 mg by mouth daily.      No current facility-administered medications for this visit.    Allergies  Allergen Reactions  . Tetracyclines & Related   . Vicodin [Hydrocodone-Acetaminophen]     Nausea     Exam:  BP 124/77 (BP Location: Left Arm, Patient Position: Sitting, Cuff Size: Normal)   Pulse 70   Temp 97.8 F (36.6 C) (Oral)   Wt 242 lb (109.8 kg)   SpO2 98%   BMI 37.90 kg/m  General: Well Developed, well nourished, and in no acute distress.  Neuro/Psych: Alert  and oriented x3, extra-ocular muscles intact, able to move all 4 extremities, sensation grossly intact. Skin: Warm and dry, no rashes noted.  Respiratory: Not using accessory muscles, speaking in full sentences, trachea midline.  Cardiovascular: Pulses palpable, no extremity edema. Abdomen: Does not appear distended. MSK: Tender palpation right SI joint area.  Procedure: Real-time Ultrasound Guided Injection of right SI joint  Device: GE Logiq E  Images permanently stored and available for review in the ultrasound unit. Verbal informed consent obtained. Discussed risks and benefits of procedure. Warned about infection bleeding damage to structures skin hypopigmentation and fat atrophy among others. Patient expresses understanding and agreement Time-out conducted.  Noted no overlying erythema, induration, or other signs of local infection.  Skin prepped in a  sterile fashion.  Local anesthesia: Topical Ethyl chloride.  With sterile technique and under real time ultrasound guidance: 80 mg Kenalog and 4 mL of Marcaine injected easily.  Completed without difficulty  Pain partially resolved suggesting accurate placement of the medication.  Advised to call if fevers/chills, erythema, induration, drainage, or persistent bleeding.  Images permanently stored and available for review in the ultrasound unit.  Impression: Technically successful ultrasound guided injection.   No results found for this or any previous visit (from the past 48 hour(s)). No results found.    Assessment and Plan: 61 y.o. female with right SI joint pain. Injected today. Continue home therapy exercises. Recheck as needed.    No orders of the defined types were placed in this encounter.  No orders of the defined types were placed in this encounter.   Discussed warning signs or symptoms. Please see discharge instructions. Patient expresses understanding.

## 2016-07-23 NOTE — Patient Instructions (Signed)
Thank you for coming in today.   Call or go to the ER if you develop a large red swollen joint with extreme pain or oozing puss.    Return as needed.  

## 2016-07-30 ENCOUNTER — Ambulatory Visit (HOSPITAL_COMMUNITY): Payer: Federal, State, Local not specified - PPO | Attending: Cardiology

## 2016-07-30 ENCOUNTER — Ambulatory Visit (INDEPENDENT_AMBULATORY_CARE_PROVIDER_SITE_OTHER): Payer: Federal, State, Local not specified - PPO

## 2016-07-30 DIAGNOSIS — R600 Localized edema: Secondary | ICD-10-CM

## 2016-07-30 DIAGNOSIS — R0602 Shortness of breath: Secondary | ICD-10-CM | POA: Diagnosis not present

## 2016-07-30 DIAGNOSIS — R531 Weakness: Secondary | ICD-10-CM | POA: Diagnosis not present

## 2016-07-30 LAB — EXERCISE TOLERANCE TEST
CHL CUP RESTING HR STRESS: 61 {beats}/min
CHL RATE OF PERCEIVED EXERTION: 17
CSEPEDS: 0 s
CSEPEW: 7 METS
CSEPHR: 85 %
CSEPPHR: 136 {beats}/min
Exercise duration (min): 6 min
MPHR: 160 {beats}/min

## 2016-09-09 ENCOUNTER — Telehealth: Payer: Self-pay | Admitting: *Deleted

## 2016-09-09 ENCOUNTER — Other Ambulatory Visit: Payer: Self-pay | Admitting: Physician Assistant

## 2016-09-09 DIAGNOSIS — E039 Hypothyroidism, unspecified: Secondary | ICD-10-CM

## 2016-09-09 NOTE — Telephone Encounter (Signed)
Patient called to have lab orders faxed to lab to have thyroid checked.

## 2016-09-11 ENCOUNTER — Other Ambulatory Visit: Payer: Self-pay

## 2016-09-11 LAB — TSH: TSH: 0.96 mIU/L

## 2016-09-12 LAB — T4, FREE: FREE T4: 0.8 ng/dL (ref 0.8–1.8)

## 2016-09-12 LAB — T3, FREE: T3 FREE: 3.4 pg/mL (ref 2.3–4.2)

## 2016-09-16 ENCOUNTER — Other Ambulatory Visit: Payer: Self-pay | Admitting: Physician Assistant

## 2016-11-07 ENCOUNTER — Other Ambulatory Visit: Payer: Self-pay | Admitting: Physician Assistant

## 2016-11-07 DIAGNOSIS — E039 Hypothyroidism, unspecified: Secondary | ICD-10-CM

## 2016-11-13 DIAGNOSIS — K080 Exfoliation of teeth due to systemic causes: Secondary | ICD-10-CM | POA: Diagnosis not present

## 2016-11-24 ENCOUNTER — Ambulatory Visit (INDEPENDENT_AMBULATORY_CARE_PROVIDER_SITE_OTHER): Payer: Self-pay

## 2016-11-24 ENCOUNTER — Other Ambulatory Visit: Payer: Self-pay | Admitting: Gerontology

## 2016-11-24 DIAGNOSIS — S5002XA Contusion of left elbow, initial encounter: Secondary | ICD-10-CM

## 2016-11-24 DIAGNOSIS — S5012XA Contusion of left forearm, initial encounter: Secondary | ICD-10-CM

## 2016-11-26 ENCOUNTER — Ambulatory Visit (INDEPENDENT_AMBULATORY_CARE_PROVIDER_SITE_OTHER): Payer: Self-pay | Admitting: Family Medicine

## 2016-11-26 ENCOUNTER — Encounter: Payer: Self-pay | Admitting: Family Medicine

## 2016-11-26 VITALS — BP 115/74 | HR 71 | Wt 245.0 lb

## 2016-11-26 DIAGNOSIS — K080 Exfoliation of teeth due to systemic causes: Secondary | ICD-10-CM | POA: Diagnosis not present

## 2016-11-26 DIAGNOSIS — M533 Sacrococcygeal disorders, not elsewhere classified: Secondary | ICD-10-CM | POA: Diagnosis not present

## 2016-11-26 NOTE — Progress Notes (Signed)
   Kristy Jones is a 61 y.o. female who presents to [facility] today for back pain secondary to worker's comp injury. Patient has right low back pain attributable to SI joint dysfunction. 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 She's had this issue off and on now for several years. She's done well with intermittent SI joint injections approximately every 5 months. Her last injection was May 30 of 2018. She notes this worked well until the pain started returning recently. She is here today for repeat injection if possible.   Past medical surgical and social history reviewed  ROS:  As above   Medications and allergies reviewed  Exam:  BP 115/74   Pulse 71   Wt 245 lb (111.1 kg)   BMI 38.37 kg/m  General: Well Developed, well nourished, and in no acute distress.  Neuro/Psych: Alert and oriented x3, extra-ocular muscles intact, able to move all 4 extremities, sensation grossly intact. Skin: Warm and dry, no rashes noted.  Respiratory: Not using accessory muscles, speaking in full sentences, trachea midline.  Cardiovascular: Pulses palpable, no extremity edema. Abdomen: Does not appear distended. MSK: Tender palpation right SI joint. Normal leg motion and gait.   Procedure: Real-time Ultrasound Guided Injection of right SI joint  Device: GE Logiq E  Images permanently stored and available for review in the ultrasound unit. Verbal informed consent obtained. Discussed risks and benefits of procedure. Warned about infection bleeding damage to structures skin hypopigmentation and fat atrophy among others. Patient expresses understanding and agreement Time-out conducted.  Noted no overlying erythema, induration, or other signs of local infection.  Skin prepped in a sterile fashion.  Local anesthesia: Topical Ethyl chloride.  With sterile technique and under real time ultrasound guidance:  80 mg Depo-Medrol and 3 mL of Marcaine injected easily.  Completed without difficulty  Pain partially resolved suggesting accurate placement of the medication.  Advised to call if fevers/chills, erythema, induration, drainage, or persistent bleeding.  Images permanently stored and available for review in the ultrasound unit.  Impression: Technically successful ultrasound guided injection      Assessment and Plan: 61 y.o. female with right SI joint dysfunction. Injected today. Recheck in the near future if not doing well. Otherwise return as needed.    No orders of the defined types were placed in this encounter.  No orders of the defined types were placed in this encounter.   Discussed warning signs or symptoms. Please see discharge instructions. Patient expresses understanding.  I spent 15 minutes with this patient, greater than 50% was face-to-face time counseling regarding ddx and treatment plan.

## 2016-11-26 NOTE — Patient Instructions (Signed)
Thank you for coming in today. Call or go to the ER if you develop a large red swollen joint with extreme pain or oozing puss.  Return as needed.  If not better we can advance the therapy.

## 2016-12-01 ENCOUNTER — Other Ambulatory Visit: Payer: Self-pay | Admitting: Physician Assistant

## 2016-12-12 ENCOUNTER — Other Ambulatory Visit: Payer: Self-pay | Admitting: Physician Assistant

## 2016-12-12 DIAGNOSIS — E039 Hypothyroidism, unspecified: Secondary | ICD-10-CM

## 2016-12-17 ENCOUNTER — Other Ambulatory Visit: Payer: Self-pay | Admitting: Physician Assistant

## 2016-12-17 ENCOUNTER — Other Ambulatory Visit: Payer: Self-pay | Admitting: *Deleted

## 2016-12-17 DIAGNOSIS — E039 Hypothyroidism, unspecified: Secondary | ICD-10-CM

## 2016-12-17 MED ORDER — THYROID 90 MG PO TABS: ORAL_TABLET | ORAL | 2 refills | Status: DC

## 2016-12-25 ENCOUNTER — Other Ambulatory Visit: Payer: Self-pay | Admitting: Physician Assistant

## 2017-01-07 ENCOUNTER — Encounter: Payer: Self-pay | Admitting: *Deleted

## 2017-01-08 ENCOUNTER — Other Ambulatory Visit: Payer: Self-pay | Admitting: Physician Assistant

## 2017-01-08 DIAGNOSIS — R600 Localized edema: Secondary | ICD-10-CM

## 2017-01-21 ENCOUNTER — Telehealth: Payer: Self-pay | Admitting: *Deleted

## 2017-01-21 ENCOUNTER — Other Ambulatory Visit: Payer: Self-pay | Admitting: Physician Assistant

## 2017-01-21 NOTE — Telephone Encounter (Signed)
Ibuprofen and all NSAIDs can create some easy bruising. Certainly ok to try tylenol but may be better to alterate and use both. NSAIDs help with inflammation more. Also consider tumeric 500mg  bid for pain.

## 2017-01-21 NOTE — Telephone Encounter (Signed)
Pt left vm yesterday asking if there is something better for her to take for pain other than her prescription Ibuprofen.  She thinks it could be what causes her to bruise so easily and was asking if Tylenol Extra Strength was any better.  Please advise.

## 2017-01-23 NOTE — Telephone Encounter (Signed)
Pt notified of recommendations.  She states she currently takes 300mg  of turmeric so I advised her that she can increase to 500mg  bid.

## 2017-02-10 ENCOUNTER — Other Ambulatory Visit: Payer: Self-pay | Admitting: Physician Assistant

## 2017-02-10 DIAGNOSIS — R600 Localized edema: Secondary | ICD-10-CM

## 2017-02-13 ENCOUNTER — Other Ambulatory Visit: Payer: Self-pay | Admitting: Physician Assistant

## 2017-02-25 ENCOUNTER — Ambulatory Visit (INDEPENDENT_AMBULATORY_CARE_PROVIDER_SITE_OTHER): Payer: Federal, State, Local not specified - PPO | Admitting: Physician Assistant

## 2017-02-25 ENCOUNTER — Encounter: Payer: Self-pay | Admitting: Physician Assistant

## 2017-02-25 ENCOUNTER — Telehealth: Payer: Self-pay | Admitting: *Deleted

## 2017-02-25 VITALS — BP 138/70 | HR 84 | Ht 67.0 in | Wt 243.0 lb

## 2017-02-25 DIAGNOSIS — Z566 Other physical and mental strain related to work: Secondary | ICD-10-CM | POA: Diagnosis not present

## 2017-02-25 DIAGNOSIS — F419 Anxiety disorder, unspecified: Secondary | ICD-10-CM

## 2017-02-25 MED ORDER — VILAZODONE HCL 10 MG PO TABS: ORAL_TABLET | ORAL | 0 refills | Status: DC

## 2017-02-25 NOTE — Telephone Encounter (Signed)
Pt left vm stating that the medication you sent in for her today is over $300 with her ins.

## 2017-02-25 NOTE — Progress Notes (Signed)
Subjective:    Patient ID: Kristy Jones, female    DOB: 03-Feb-1956, 62 y.o.   MRN: [id number]  HPI  Pt is a 62 yo female who presents to the clinic to discuss work stress and feeling very overwhelmed. She works for [employer] and recently we wrote her a work note to only work 8 hours a day due to her hip bursitis.  Working over 8 hours was giving her tremendous hip pain at night.  This has improved with the restrictions.  Since this note her supervisors have been making comments about her.  They have stated that she is a "burden" and that there is "no reason why she should not be working like the rest of them".  They are constantly making like hard on current at work.  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  She finds herself emotional most days.  She finds herself taking at least one maybe 2 Klonopin a day.  She feels like she is going to have a "nervous breakdown". She feels like she cannot get her job done because they make her feel so "watched" and like she is not doing her job. She talked with the union and they suggested her to take some time off to reboot and then see if things change.  If not she will be making a formal complaint with the union.  She denies any suicidal or homicidal thoughts.  She does feel mentally unstable and like her job situation is causing her to use more of her Klonopin.  .. Active Ambulatory Problems    Diagnosis Date Noted  . Hypothyroidism 03/08/2009  . 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 Insomnia 02/13/2012  . Anxiety 02/13/2012  . Constipation 07/08/2013  . Hip bursitis 06/14/2014  . 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 DDD (degenerative disc disease), lumbar 06/14/2014  . Right-sided low back pain without sciatica 06/18/2014  . Piriformis syndrome 09/12/2014  . Muscle cramps 11/09/2014  . Edema of right lower extremity 11/09/2014  . Bruising 04/11/2015  . Obesity 04/11/2015  . Abnormal  weight gain 04/11/2015  . Eosinophilia 04/13/2015  . SOB (shortness of breath) 07/16/2016  . Bilateral lower extremity edema 07/16/2016  . Weakness 07/16/2016  . Stress at work 02/25/2017   Resolved Ambulatory Problems    Diagnosis Date Noted  . HAND PAIN, LEFT 03/08/2009  . Acute sinusitis, unspecified 08/12/2010   Past Medical History:  Diagnosis Date  . Lichen sclerosus   . Thyroid disease       Review of Systems  All other systems reviewed and are negative.      Objective:   Physical Exam  Constitutional: She is oriented to person, place, and time. She appears well-developed and well-nourished.  Cardiovascular: Normal rate, regular rhythm and normal heart sounds.  Pulmonary/Chest: Effort normal and breath sounds normal.  Neurological: She is alert and oriented to person, place, and time.  Psychiatric: Her behavior is normal.  Very tearful.           Assessment & Plan:  Marland KitchenMarland KitchenDiagnoses and all orders for this visit:  Stress at work  Anxiety  Other orders -     Vilazodone HCl (VIIBRYD) 10 MG TABS; Take one tablet for 7 days then increase to 2 tablets daily.  .. Depression screen [facility] 2/9 02/25/2017 11/26/2016  Decreased Interest 0 0  Down, Depressed, Hopeless 0 0  PHQ - 2 Score 0 0   .Marland Kitchen GAD 7 :  Generalized Anxiety Score 02/25/2017  Nervous, Anxious, on Edge 3  Control/stop worrying 3  Worry too much - different things 3  Trouble relaxing 3  Restless 2  Easily annoyed or irritable 3  Afraid - awful might happen 3  Total GAD 7 Score 20  Anxiety Difficulty Very difficult     Pt has tried lexapro with no benefit in the past. Will try viibryd. Discussed increasing to 2 tablets after 7 days.  Encouraged counseling to work through how to handle comments and bad situations. She will consider.  Continue clonazepam as needed.  Written out of work until 03/16/17 for stress anxiety.  Follow up 03/13/17.   Marland Kitchen.Marland Kitchen.Spent 30 minutes with patient and greater than 50 percent  of visit spent counseling patient regarding treatment plan.

## 2017-02-26 MED ORDER — VENLAFAXINE HCL ER 37.5 MG PO CP24: 37.5000 mg | ORAL_CAPSULE | Freq: Every day | ORAL | 1 refills | Status: DC

## 2017-02-26 NOTE — Telephone Encounter (Signed)
Pt notified of new rx.

## 2017-02-26 NOTE — Telephone Encounter (Signed)
I sent effexor to the pharmacy.

## 2017-03-06 ENCOUNTER — Encounter: Payer: Self-pay | Admitting: Physician Assistant

## 2017-03-06 ENCOUNTER — Other Ambulatory Visit: Payer: Self-pay | Admitting: Physician Assistant

## 2017-03-08 ENCOUNTER — Other Ambulatory Visit: Payer: Self-pay | Admitting: Physician Assistant

## 2017-03-08 DIAGNOSIS — E039 Hypothyroidism, unspecified: Secondary | ICD-10-CM

## 2017-03-11 ENCOUNTER — Ambulatory Visit: Payer: Self-pay | Admitting: Physician Assistant

## 2017-03-12 DIAGNOSIS — K080 Exfoliation of teeth due to systemic causes: Secondary | ICD-10-CM | POA: Diagnosis not present

## 2017-03-13 ENCOUNTER — Ambulatory Visit: Payer: Self-pay | Admitting: Physician Assistant

## 2017-03-16 ENCOUNTER — Ambulatory Visit (INDEPENDENT_AMBULATORY_CARE_PROVIDER_SITE_OTHER): Payer: Federal, State, Local not specified - PPO | Admitting: Physician Assistant

## 2017-03-16 ENCOUNTER — Encounter: Payer: Self-pay | Admitting: Physician Assistant

## 2017-03-16 VITALS — BP 135/78 | HR 56 | Ht 67.0 in | Wt 244.0 lb

## 2017-03-16 DIAGNOSIS — Z566 Other physical and mental strain related to work: Secondary | ICD-10-CM | POA: Diagnosis not present

## 2017-03-16 DIAGNOSIS — F419 Anxiety disorder, unspecified: Secondary | ICD-10-CM | POA: Diagnosis not present

## 2017-03-16 MED ORDER — VENLAFAXINE HCL ER 37.5 MG PO CP24: 37.5000 mg | ORAL_CAPSULE | Freq: Every day | ORAL | 4 refills | Status: DC

## 2017-03-16 NOTE — Progress Notes (Signed)
   Subjective:    Patient ID: Kristy Jones, female    DOB: 06/14/1955, 62 y.o.   MRN: [id number]  HPI Pt is a 62 yo female who presents to the clinic to follow up on stress at work and anxiety. She was written out for 2 weeks. She is doing much better. She was started on effexor at last visit and doing great. No problems with medications. No SI/HC.   Marland Kitchen.. Active Ambulatory Problems    Diagnosis Date Noted  . Hypothyroidism 03/08/2009  . 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 Insomnia 02/13/2012  . Anxiety 02/13/2012  . Constipation 07/08/2013  . Hip bursitis 06/14/2014  . 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 DDD (degenerative disc disease), lumbar 06/14/2014  . Right-sided low back pain without sciatica 06/18/2014  . Piriformis syndrome 09/12/2014  . Muscle cramps 11/09/2014  . Edema of right lower extremity 11/09/2014  . Bruising 04/11/2015  . Obesity 04/11/2015  . Abnormal weight gain 04/11/2015  . Eosinophilia 04/13/2015  . SOB (shortness of breath) 07/16/2016  . Bilateral lower extremity edema 07/16/2016  . Weakness 07/16/2016  . Stress at work 02/25/2017   Resolved Ambulatory Problems    Diagnosis Date Noted  . HAND PAIN, LEFT 03/08/2009  . Acute sinusitis, unspecified 08/12/2010   Past Medical History:  Diagnosis Date  . Lichen sclerosus   . Thyroid disease       Review of Systems  All other systems reviewed and are negative.      Objective:   Physical Exam  Constitutional: She is oriented to person, place, and time. She appears well-developed and well-nourished.  HENT:  Head: Normocephalic and atraumatic.  Cardiovascular: Normal rate, regular rhythm and normal heart sounds.  Pulmonary/Chest: Effort normal and breath sounds normal.  Neurological: She is alert and oriented to person, place, and time.  Psychiatric: She has a normal mood and affect. Her behavior is normal.          Assessment & Plan:    Marland Kitchen.Marland Kitchen.Diagnoses and all orders  for this visit:  Anxiety -     venlafaxine XR (EFFEXOR-XR) 37.5 MG 24 hr capsule; Take 1 capsule (37.5 mg total) by mouth daily with breakfast.  Stress at work -     venlafaxine XR (EFFEXOR-XR) 37.5 MG 24 hr capsule; Take 1 capsule (37.5 mg total) by mouth daily with breakfast.   .. [facility] 2[facility] 03/16/2017 02/25/2017 11/26/2016  Decreased Interest 0 0 0  Down, Depressed, Hopeless 0 0 0  PHQ - 2 Score 0 0 0  Altered sleeping 0 - -  Tired, decreased energy 0 - -  Change in appetite 0 - -  Feeling bad or failure about yourself  0 - -  Trouble concentrating 0 - -  Moving slowly or fidgety/restless 0 - -  Suicidal thoughts 0 - -  PHQ-9 Score 0 - -   .Marland Kitchen. GAD 7 : Generalized Anxiety Score 03/16/2017 02/25/2017  Nervous, Anxious, on Edge 0 3  Control/stop worrying 0 3  Worry too much - different things 0 3  Trouble relaxing 0 3  Restless 0 2  Easily annoyed or irritable 0 3  Afraid - awful might happen 0 3  Total GAD 7 Score 0 20  Anxiety Difficulty - Very difficult    Pt released to go back to work. Given letter.  Follow up in 6 months.  Follow up as needed.

## 2017-03-22 ENCOUNTER — Other Ambulatory Visit: Payer: Self-pay | Admitting: Physician Assistant

## 2017-03-22 DIAGNOSIS — E039 Hypothyroidism, unspecified: Secondary | ICD-10-CM

## 2017-03-23 MED ORDER — THYROID 90 MG PO TABS: ORAL_TABLET | ORAL | 0 refills | Status: DC

## 2017-04-09 ENCOUNTER — Other Ambulatory Visit: Payer: Self-pay | Admitting: Physician Assistant

## 2017-04-09 DIAGNOSIS — E039 Hypothyroidism, unspecified: Secondary | ICD-10-CM

## 2017-04-09 DIAGNOSIS — K080 Exfoliation of teeth due to systemic causes: Secondary | ICD-10-CM | POA: Diagnosis not present

## 2017-05-04 ENCOUNTER — Other Ambulatory Visit: Payer: Self-pay | Admitting: *Deleted

## 2017-05-04 MED ORDER — IBUPROFEN 800 MG PO TABS: ORAL_TABLET | ORAL | 0 refills | Status: DC

## 2017-05-04 MED ORDER — CLONAZEPAM 0.5 MG PO TABS: 0.5000 mg | ORAL_TABLET | Freq: Two times a day (BID) | ORAL | 1 refills | Status: DC | PRN

## 2017-05-04 NOTE — Telephone Encounter (Signed)
Pt left vm requesting refills on clonazepam & ibuprofen.

## 2017-06-04 ENCOUNTER — Ambulatory Visit (INDEPENDENT_AMBULATORY_CARE_PROVIDER_SITE_OTHER): Payer: Federal, State, Local not specified - PPO | Admitting: Physician Assistant

## 2017-06-04 ENCOUNTER — Encounter: Payer: Self-pay | Admitting: Physician Assistant

## 2017-06-04 VITALS — BP 138/82 | HR 73 | Ht 67.0 in | Wt 244.0 lb

## 2017-06-04 DIAGNOSIS — M7702 Medial epicondylitis, left elbow: Secondary | ICD-10-CM | POA: Diagnosis not present

## 2017-06-04 DIAGNOSIS — Z566 Other physical and mental strain related to work: Secondary | ICD-10-CM | POA: Diagnosis not present

## 2017-06-04 NOTE — Patient Instructions (Signed)
Golfer's Elbow Golfer's elbow, also called medial epicondylitis, is a condition that results from inflammation of the strong bands of tissue (tendons) that attach your forearm muscles to the inside of your bone at the elbow. These tendons affect the muscles that bend the palm toward the wrist (flexion). This condition is called golfer's elbow because it is more common among people who constantly bend and twist their wrists, such as golfers. This injury usually results from overuse. Tendons also become less flexible with age. This condition causes elbow pain that may spread to your forearm and upper arm. The pain may get worse when you bend your wrist downward. What are the causes? This condition is an overuse injury that is caused by:  Repeatedly flexing, turning, or twisting your wrist.  Constantly gripping objects with your hands.  What increases the risk? This condition is more likely to develop in people who play golf or tennis or have jobs that require the constant use of their hands. This injury is more common among:  Carpenters.  Gardeners.  Musicians.  Bricklayers.  Typists.  What are the signs or symptoms? Symptoms of this condition include:  Pain near the inner elbow or forearm.  Reduced grip strength.  How is this diagnosed? This condition is diagnosed based on your symptoms, medical history, and physical exam. During the exam, your health care provider may test your grip strength and move your wrist to check for pain. You may also have an MRI to confirm the diagnosis, look for other issues, and check for tears in the ligaments, muscles, or tendons. How is this treated? Treatment for this condition includes:  Stopping all activities that make you bend or twist your wrist until your pain and other symptoms go away.  Icing your wrist to relieve pain.  Taking NSAIDs or getting corticosteroid injections to reduce pain and swelling.  Doing stretches, range-of-motion,  and strengthening exercises (physical therapy) as told by your health care provider.  In rare cases, surgery may be needed if your condition does not improve. Follow these instructions at home:  If directed, apply ice to the injured area. ? Put ice in a plastic bag. ? Place a towel between your skin and the bag. ? Leave the ice on for 20 minutes, 2-3 times a day.  Move your fingers often to avoid stiffness.  Raise (elevate) the injured area above the level of your heart while you are sitting or lying down.  Return to your normal activities as told by your health care provider. Ask your health care provider what activities are safe for you.  Do exercises as told by your health care provider.  Do not use tobacco products, including cigarettes, chewing tobacco, or e-cigarettes. If you need help quitting, ask your health care provider.  Take over-the-counter and prescription medicines only as told by your health care provider.  Keep all follow-up visits as told by your health care provider. This is important. How is this prevented?  Warm up and stretch before being active.  Cool down and stretch after being active.  Give your body time to rest between periods of activity.  Make sure to use equipment that fits you.  Be safe and responsible while being active to avoid falls.  Do at least 150 minutes of moderate-intensity exercise each week, such as brisk walking or water aerobics.  Maintain physical fitness, including: ? Strength. ? Flexibility. ? Cardiovascular fitness. ? Endurance.  Perform exercises to strengthen the forearm muscles.  Slow your golf   swing to reduce shock in the arm when making contact with the ball, if you play golf. Contact a health care provider if:  Your pain does not improve or it gets worse.  You notice numbness in your hand. Get help right away if:  Your pain is severe.  You cannot move your wrist. This information is not intended to replace  advice given to you by your health care provider. Make sure you discuss any questions you have with your health care provider. Document Released: 02/10/2005 Document Revised: 10/16/2015 Document Reviewed: 10/23/2014 Elsevier Interactive Patient Education  2018 Elsevier Inc.  

## 2017-06-04 NOTE — Progress Notes (Signed)
Subjective:    Patient ID: Kristy Jones, female    DOB: 10-12-1955, 62 y.o.   MRN: [id number]  HPI  Pt is a 62 yo female who presents to the clinic to discuss left elbow pain and increase in anxiety and depression. 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 Her left elbow is hurting more and more. Work makes worse and rest makes better. Ibuprofen does help. She also takes tumeric.   Her anxiety and depression has worsened more and more over last few months. She is under a lot of stress for production and no time off. She is retiring in September and trying to make it until then. Denies SI/HI.she was started on effexor a few months ago after being written out of work for a few weeks. At one point her anxiety and depression was very well controlled.   .. Active Ambulatory Problems    Diagnosis Date Noted  . Hypothyroidism 03/08/2009  . 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 Insomnia 02/13/2012  . Anxiety 02/13/2012  . Constipation 07/08/2013  . Hip bursitis 06/14/2014  . 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 DDD (degenerative disc disease), lumbar 06/14/2014  . Right-sided low back pain without sciatica 06/18/2014  . Piriformis syndrome 09/12/2014  . Muscle cramps 11/09/2014  . Edema of right lower extremity 11/09/2014  . Bruising 04/11/2015  . Obesity 04/11/2015  . Abnormal weight gain 04/11/2015  . Eosinophilia 04/13/2015  . SOB (shortness of breath) 07/16/2016  . Bilateral lower extremity edema 07/16/2016  . Weakness 07/16/2016  . Stress at work 02/25/2017  . Medial epicondylitis, left 06/07/2017   Resolved Ambulatory Problems    Diagnosis Date Noted  . HAND PAIN, LEFT 03/08/2009  . Acute sinusitis, unspecified 08/12/2010   Past Medical History:  Diagnosis Date  . Lichen sclerosus   . Thyroid disease       Review of Systems  All other systems reviewed and are negative.      Objective:   Physical Exam   Constitutional: She is oriented to person, place, and time. She appears well-developed and well-nourished.  HENT:  Head: Normocephalic and atraumatic.  Cardiovascular: Normal rate, regular rhythm and normal heart sounds.  Pulmonary/Chest: Effort normal and breath sounds normal.  Musculoskeletal:  No swelling noted over left elbow. Tenderness over left medial epicondyle. Strength of upper left extremity 5/5.   Neurological: She is alert and oriented to person, place, and time.  Psychiatric: She has a normal mood and affect. Her behavior is normal.          Assessment & Plan:  Marland KitchenMarland KitchenDiagnoses and all orders for this visit:  Medial epicondylitis, left  Stress at work  .Marland Kitchen Depression screen [facility] 2/9 06/04/2017 03/16/2017 02/25/2017 11/26/2016  Decreased Interest 1 0 0 0  Down, Depressed, Hopeless 0 0 0 0  PHQ - 2 Score 1 0 0 0  Altered sleeping 1 0 - -  Tired, decreased energy 2 0 - -  Change in appetite 2 0 - -  Feeling bad or failure about yourself  0 0 - -  Trouble concentrating 0 0 - -  Moving slowly or fidgety/restless 0 0 - -  Suicidal thoughts 0 0 - -  PHQ-9 Score 6 0 - -  Difficult doing work/chores Somewhat difficult - - -   .Marland Kitchen GAD 7 : Generalized Anxiety Score 06/04/2017 03/16/2017 02/25/2017  Nervous, Anxious, on Edge 1 0 3  Control/stop worrying 1 0  3  Worry too much - different things 1 0 3  Trouble relaxing 1 0 3  Restless 1 0 2  Easily annoyed or irritable 2 0 3  Afraid - awful might happen 0 0 3  Total GAD 7 Score 7 0 20  Anxiety Difficulty Somewhat difficult - Very difficult   Anxiety and Depression has worsened since last visit.it seems to be related to her work environment. Discussed increase in effexor declined today. Discussed coping mechanisms.  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 Written out of work for 3 weeks. Placed in aircast elbow brace. Ice and start exercises.   Marland Kitchen..Spent 30  minutes with patient and greater than 50 percent of visit spent counseling patient regarding treatment plan.

## 2017-06-07 DIAGNOSIS — M7702 Medial epicondylitis, left elbow: Secondary | ICD-10-CM | POA: Insufficient documentation

## 2017-06-08 DIAGNOSIS — Z6838 Body mass index (BMI) 38.0-38.9, adult: Secondary | ICD-10-CM | POA: Diagnosis not present

## 2017-06-08 DIAGNOSIS — Z01419 Encounter for gynecological examination (general) (routine) without abnormal findings: Secondary | ICD-10-CM | POA: Diagnosis not present

## 2017-06-08 DIAGNOSIS — L900 Lichen sclerosus et atrophicus: Secondary | ICD-10-CM | POA: Diagnosis not present

## 2017-06-08 DIAGNOSIS — Z1231 Encounter for screening mammogram for malignant neoplasm of breast: Secondary | ICD-10-CM | POA: Diagnosis not present

## 2017-06-08 LAB — HM MAMMOGRAPHY

## 2017-06-08 LAB — HM PAP SMEAR: HM Pap smear: NEGATIVE

## 2017-06-09 ENCOUNTER — Other Ambulatory Visit: Payer: Self-pay | Admitting: Physician Assistant

## 2017-06-09 DIAGNOSIS — E039 Hypothyroidism, unspecified: Secondary | ICD-10-CM

## 2017-06-10 ENCOUNTER — Encounter: Payer: Self-pay | Admitting: Physician Assistant

## 2017-06-24 ENCOUNTER — Telehealth: Payer: Self-pay | Admitting: *Deleted

## 2017-06-24 ENCOUNTER — Telehealth: Payer: Self-pay | Admitting: Physician Assistant

## 2017-06-24 DIAGNOSIS — E039 Hypothyroidism, unspecified: Secondary | ICD-10-CM

## 2017-06-24 NOTE — Telephone Encounter (Signed)
Ok papers filled out. Please make copy and scan. Please call patient and let her know ready for pick up.

## 2017-06-24 NOTE — Telephone Encounter (Signed)
Thyroid labs ordered

## 2017-06-24 NOTE — Telephone Encounter (Signed)
Spoke with pt.  Thyroid labs ordered.  Pt would like for you to add 1-2 days off every other week on her FMLA forms when you fill them out.

## 2017-06-24 NOTE — Telephone Encounter (Signed)
Loretto called a few times and states that [employer] she is out of the office and she's been calling her this week.  Can you call patient back today so she can talk to you about Thyroid and the papers she dropped off Thanks

## 2017-06-25 NOTE — Telephone Encounter (Signed)
Pt notified that forms are ready for her to pick up.

## 2017-06-29 DIAGNOSIS — E039 Hypothyroidism, unspecified: Secondary | ICD-10-CM | POA: Diagnosis not present

## 2017-06-30 ENCOUNTER — Other Ambulatory Visit: Payer: Self-pay | Admitting: Physician Assistant

## 2017-06-30 LAB — T4, FREE: Free T4: 0.6 ng/dL — ABNORMAL LOW (ref 0.8–1.8)

## 2017-06-30 LAB — T3, FREE: T3 FREE: 2.6 pg/mL (ref 2.3–4.2)

## 2017-06-30 LAB — TSH: TSH: 14.9 mIU/L — ABNORMAL HIGH (ref 0.40–4.50)

## 2017-06-30 MED ORDER — THYROID 120 MG PO TABS: 120.0000 mg | ORAL_TABLET | Freq: Every day | ORAL | 1 refills | Status: DC

## 2017-06-30 NOTE — Telephone Encounter (Signed)
Increased thyroid medication to . Labs in 4-6 weeks. Thyroid hypoactive at this time.

## 2017-07-08 ENCOUNTER — Other Ambulatory Visit: Payer: Self-pay | Admitting: Physician Assistant

## 2017-07-08 NOTE — Telephone Encounter (Signed)
[facility] requesting RF on Clonazepam  Last written 05-04-17 for #30 with 1 RF  RX pended, please send if appropriate

## 2017-07-09 DIAGNOSIS — K080 Exfoliation of teeth due to systemic causes: Secondary | ICD-10-CM | POA: Diagnosis not present

## 2017-07-21 ENCOUNTER — Other Ambulatory Visit: Payer: Self-pay | Admitting: Physician Assistant

## 2017-07-21 NOTE — Telephone Encounter (Signed)
[facility] requesting RF on Trazodone.   RX pended

## 2017-07-30 ENCOUNTER — Encounter: Payer: Self-pay | Admitting: Family Medicine

## 2017-07-30 ENCOUNTER — Other Ambulatory Visit: Payer: Self-pay | Admitting: Physician Assistant

## 2017-07-30 ENCOUNTER — Ambulatory Visit (INDEPENDENT_AMBULATORY_CARE_PROVIDER_SITE_OTHER): Admitting: Family Medicine

## 2017-07-30 VITALS — BP 129/70 | HR 66 | Wt 233.0 lb

## 2017-07-30 DIAGNOSIS — M533 Sacrococcygeal disorders, not elsewhere classified: Secondary | ICD-10-CM

## 2017-07-30 NOTE — Patient Instructions (Signed)
Thank you for coming in today. Call or go to the ER if you develop a large red swollen joint with extreme pain or oozing puss.   Continue exercises and recheck as needed

## 2017-07-31 NOTE — Progress Notes (Signed)
   Kristy Jones is a 62 y.o. female who [facility] for back pain secondary to worker's comp injury. Patient has right low back pain attributable to SI joint dysfunction. 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 She leans to the right to pick packages. She's had this issue off and on now for several years. She's done well with intermittent SI joint injections approximately every 6 months or so. Her last injection was November 26, 2016. She notes this worked well until the pain started returning recently. She is here today for repeat injection if possible.   Past medical surgical and social history reviewed  ROS:  As above   Medications and allergies reviewed  Exam:  BP 129/70   Pulse 66   Wt 233 lb (105.7 kg)   BMI 36.49 kg/m  General: Well Developed, well nourished, and in no acute distress.  Neuro/Psych: Alert and oriented x3, extra-ocular muscles intact, able to move all 4 extremities, sensation grossly intact. Skin: Warm and dry, no rashes noted.  Respiratory: Not using accessory muscles, speaking in full sentences, trachea midline.  Cardiovascular: Pulses palpable, no extremity edema. Abdomen: Does not appear distended. MSK: Tender palpation right SI joint. Normal leg motion and gait.   Procedure: Real-time Ultrasound Guided Injection of right SI joint  Device: GE Logiq E  Images permanently stored and available for review in the ultrasound unit. Verbal informed consent obtained. Discussed risks and benefits of procedure. Warned about infection bleeding damage to structures skin hypopigmentation and fat atrophy among others. Patient expresses understanding and agreement Time-out conducted.  Noted no overlying erythema, induration, or other signs of local infection.  Skin prepped in a sterile fashion.  Local anesthesia: Topical Ethyl chloride.  With sterile technique and under real time ultrasound  guidance: 80 mg Depo-Medrol and 3 mL of Marcaine injected easily.  Completed without difficulty  Pain partially resolved suggesting accurate placement of the medication.  Advised to call if fevers/chills, erythema, induration, drainage, or persistent bleeding.  Images permanently stored and available for review in the ultrasound unit.  Impression: Technically successful ultrasound guided injection      Assessment and Plan: 62 y.o. female with right SI joint dysfunction. Injected today. Recheck in the near future if not doing well. Otherwise return as needed.    No orders of the defined types were placed in this encounter.  No orders of the defined types were placed in this encounter.   Discussed warning signs or symptoms. Please see discharge instructions. Patient expresses understanding.  I spent 15 minutes with this patient, greater than 50% was face-to-face time counseling regarding ddx and treatment plan.

## 2017-08-21 DIAGNOSIS — K080 Exfoliation of teeth due to systemic causes: Secondary | ICD-10-CM | POA: Diagnosis not present

## 2017-08-28 ENCOUNTER — Other Ambulatory Visit: Payer: Self-pay | Admitting: Physician Assistant

## 2017-08-31 ENCOUNTER — Encounter: Payer: Self-pay | Admitting: Physician Assistant

## 2017-08-31 ENCOUNTER — Ambulatory Visit (INDEPENDENT_AMBULATORY_CARE_PROVIDER_SITE_OTHER): Payer: Federal, State, Local not specified - PPO | Admitting: Physician Assistant

## 2017-08-31 VITALS — BP 134/51 | HR 71 | Ht 67.01 in | Wt 227.0 lb

## 2017-08-31 DIAGNOSIS — Z131 Encounter for screening for diabetes mellitus: Secondary | ICD-10-CM | POA: Diagnosis not present

## 2017-08-31 DIAGNOSIS — R238 Other skin changes: Secondary | ICD-10-CM

## 2017-08-31 DIAGNOSIS — E039 Hypothyroidism, unspecified: Secondary | ICD-10-CM

## 2017-08-31 DIAGNOSIS — R233 Spontaneous ecchymoses: Secondary | ICD-10-CM

## 2017-08-31 DIAGNOSIS — Z1159 Encounter for screening for other viral diseases: Secondary | ICD-10-CM

## 2017-08-31 DIAGNOSIS — S40811A Abrasion of right upper arm, initial encounter: Secondary | ICD-10-CM

## 2017-08-31 DIAGNOSIS — Z13220 Encounter for screening for lipoid disorders: Secondary | ICD-10-CM

## 2017-08-31 DIAGNOSIS — Z1211 Encounter for screening for malignant neoplasm of colon: Secondary | ICD-10-CM

## 2017-08-31 NOTE — Progress Notes (Signed)
Subjective:    Patient ID: Kristy Jones, female    DOB: 09/29/1955, 62 y.o.   MRN: [id number]  HPI  Pt is a 62 yo female who presents to the clinic to follow up and get medication refilled. She does have some concerns as well.   Hypothyroidism- still adjusting medication to get her in the normal range. Need labs.   She is concerned with easy bruising. She wants to make sure her "bloodwork" looks ok. She is not on any ASA but she does take a daily NSAID.   She was moving a bed 2 days ago and she dropped the bed on her right forearm. She scrapped the skin off. It did bleed a lot. She has used hydrogen peroxide and antibiotic ointment. She is worried about infection when she goes back to work. Her skin is very thin she is also worried about re-injury.   .. Active Ambulatory Problems    Diagnosis Date Noted  . Hypothyroidism 03/08/2009  . 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 Insomnia 02/13/2012  . Anxiety 02/13/2012  . Constipation 07/08/2013  . Hip bursitis 06/14/2014  . 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 DDD (degenerative disc disease), lumbar 06/14/2014  . Right-sided low back pain without sciatica 06/18/2014  . Piriformis syndrome 09/12/2014  . Muscle cramps 11/09/2014  . Edema of right lower extremity 11/09/2014  . Bruising 04/11/2015  . Obesity 04/11/2015  . Abnormal weight gain 04/11/2015  . Eosinophilia 04/13/2015  . SOB (shortness of breath) 07/16/2016  . Bilateral lower extremity edema 07/16/2016  . Weakness 07/16/2016  . Stress at work 02/25/2017  . Medial epicondylitis, left 06/07/2017   Resolved Ambulatory Problems    Diagnosis Date Noted  . HAND PAIN, LEFT 03/08/2009  . Acute sinusitis, unspecified 08/12/2010   Past Medical History:  Diagnosis Date  . Lichen sclerosus   . Thyroid disease         Review of Systems    see HPI.  Objective:   Physical Exam  Constitutional: She is oriented to person, place, and time. She  appears well-developed and well-nourished.  HENT:  Head: Normocephalic and atraumatic.  Neck: No thyromegaly present.  Cardiovascular: Normal rate and regular rhythm.  Pulmonary/Chest: Effort normal and breath sounds normal.  Neurological: She is alert and oriented to person, place, and time.  Skin:  Right forearm 4cm by 3cm bruise with linear laceration that appears to be healing at the center.   Psychiatric: She has a normal mood and affect. Her behavior is normal.          Assessment & Plan:  Marland Kitchen.Marland Kitchen.Kristy Jones was seen today for thyroid problem and stress.  Diagnoses and all orders for this visit:  Acquired hypothyroidism -     TSH + free T4 -     T3, free  Screening for lipid disorders -     Lipid Panel w/reflex Direct LDL  Screening for diabetes mellitus -     COMPLETE METABOLIC PANEL WITH GFR  Easy bruising -     CBC w/Diff/Platelet  Need for hepatitis C screening test -     Hepatitis C Antibody  Colon cancer screening -     Ambulatory referral to Gastroenterology  Abrasion of right upper extremity, initial encounter   .Marland Kitchen. [facility] 08/31/2017 06/04/2017 03/16/2017 02/25/2017 11/26/2016  Decreased Interest 0 1 0 0 0  Down, Depressed, Hopeless 0 0 0 0 0  PHQ - 2 Score 0 1 0 0 0  Altered  sleeping 0 1 0 - -  Tired, decreased energy 0 2 0 - -  Change in appetite 0 2 0 - -  Feeling bad or failure about yourself  0 0 0 - -  Trouble concentrating 0 0 0 - -  Moving slowly or fidgety/restless 1 0 0 - -  Suicidal thoughts 0 0 0 - -  PHQ-9 Score 1 6 0 - -  Difficult doing work/chores Not difficult at all Somewhat difficult - - -   .Marland Kitchen GAD 7 : Generalized Anxiety Score 08/31/2017 06/04/2017 03/16/2017 02/25/2017  Nervous, Anxious, on Edge 1 1 0 3  Control/stop worrying 0 1 0 3  Worry too much - different things 0 1 0 3  Trouble relaxing 0 1 0 3  Restless 0 1 0 2  Easily annoyed or irritable 0 2 0 3  Afraid - awful might happen 0 0 0 3  Total GAD 7 Score 1 7 0 20   Anxiety Difficulty Not difficult at all Somewhat difficult - Very difficult    Will get labs and adjust medications as needed.   Pt has a physical job. Cleaned wound and bandaged. Concerned about re-injury due to thin skin. Written out of work for 3 days.    Ordered screening colonoscopy.

## 2017-09-01 ENCOUNTER — Telehealth: Payer: Self-pay

## 2017-09-01 ENCOUNTER — Encounter: Payer: Self-pay | Admitting: Physician Assistant

## 2017-09-01 DIAGNOSIS — Z13220 Encounter for screening for lipoid disorders: Secondary | ICD-10-CM | POA: Diagnosis not present

## 2017-09-01 DIAGNOSIS — Z131 Encounter for screening for diabetes mellitus: Secondary | ICD-10-CM | POA: Diagnosis not present

## 2017-09-01 DIAGNOSIS — R238 Other skin changes: Secondary | ICD-10-CM | POA: Diagnosis not present

## 2017-09-01 DIAGNOSIS — E039 Hypothyroidism, unspecified: Secondary | ICD-10-CM | POA: Diagnosis not present

## 2017-09-01 NOTE — Telephone Encounter (Signed)
Done

## 2017-09-01 NOTE — Telephone Encounter (Signed)
Referral was supposed to be placed for her to have colonoscopy. I dont see it in her chart, but it doesn't need to be sent she received a call from office she went to in 2009 and she has an appt for July 24 with Dr. Noe GensPeters on [address] Marland Kitchen-[facility]

## 2017-09-02 LAB — COMPLETE METABOLIC PANEL WITH GFR
AG RATIO: 1.8 (calc) (ref 1.0–2.5)
ALBUMIN MSPROF: 4 g/dL (ref 3.6–5.1)
ALKALINE PHOSPHATASE (APISO): 69 U/L (ref 33–130)
ALT: 20 U/L (ref 6–29)
AST: 21 U/L (ref 10–35)
BUN: 18 mg/dL (ref 7–25)
CO2: 27 mmol/L (ref 20–32)
CREATININE: 0.63 mg/dL (ref 0.50–0.99)
Calcium: 9.3 mg/dL (ref 8.6–10.4)
Chloride: 107 mmol/L (ref 98–110)
GFR, EST NON AFRICAN AMERICAN: 97 mL/min/{1.73_m2} (ref >=60)
GFR, Est African American: 112 mL/min/{1.73_m2} (ref >=60)
Globulin: 2.2 g/dL (calc) (ref 1.9–3.7)
Glucose, Bld: 98 mg/dL (ref 65–99)
POTASSIUM: 4.5 mmol/L (ref 3.5–5.3)
SODIUM: 140 mmol/L (ref 135–146)
Total Bilirubin: 0.4 mg/dL (ref 0.2–1.2)
Total Protein: 6.2 g/dL (ref 6.1–8.1)

## 2017-09-02 LAB — CBC WITH DIFFERENTIAL/PLATELET
BASOS ABS: 59 {cells}/uL (ref 0–200)
Basophils Relative: 1.2 %
Eosinophils Absolute: 348 cells/uL (ref 15–500)
Eosinophils Relative: 7.1 %
HCT: 40.4 % (ref 35.0–45.0)
HEMOGLOBIN: 13.6 g/dL (ref 11.7–15.5)
LYMPHS ABS: 1372 {cells}/uL (ref 850–3900)
MCH: 30.2 pg (ref 27.0–33.0)
MCHC: 33.7 g/dL (ref 32.0–36.0)
MCV: 89.6 fL (ref 80.0–100.0)
MONOS PCT: 11.2 %
MPV: 11.5 fL (ref 7.5–12.5)
Neutro Abs: 2573 cells/uL (ref 1500–7800)
Neutrophils Relative %: 52.5 %
PLATELETS: 242 10*3/uL (ref 140–400)
RBC: 4.51 10*6/uL (ref 3.80–5.10)
RDW: 12.2 % (ref 11.0–15.0)
TOTAL LYMPHOCYTE: 28 %
WBC mixed population: 549 cells/uL (ref 200–950)
WBC: 4.9 10*3/uL (ref 3.8–10.8)

## 2017-09-02 LAB — LIPID PANEL W/REFLEX DIRECT LDL
Cholesterol: 184 mg/dL (ref <200)
HDL: 62 mg/dL (ref >50)
LDL CHOLESTEROL (CALC): 107 mg/dL — AB
Non-HDL Cholesterol (Calc): 122 mg/dL (calc) (ref <130)
Total CHOL/HDL Ratio: 3 (calc) (ref <5.0)
Triglycerides: 65 mg/dL (ref <150)

## 2017-09-02 LAB — HEPATITIS C ANTIBODY
HEP C AB: NONREACTIVE
SIGNAL TO CUT-OFF: 0.04 (ref <1.00)

## 2017-09-02 LAB — TSH+FREE T4: TSH W/REFLEX TO FT4: 0.45 m[IU]/L (ref 0.40–4.50)

## 2017-09-02 LAB — T3, FREE: T3 FREE: 5.2 pg/mL — AB (ref 2.3–4.2)

## 2017-09-02 NOTE — Progress Notes (Signed)
Call pt: normal CBC. Normal platelets. TSH in normal range but barely more on the hyper(overactive side) and free t3 is elevated as well. I would take one full tablet and every other day take 1/2 tablet. recheck in 4-6 weeks. Lab only. Cholesterol looks great!!

## 2017-09-03 NOTE — Progress Notes (Signed)
Actually I don't think I want to decrease that much lets to 1 whole tablet every day except Tuesday and Thursday take 1/2 tablet.

## 2017-09-04 ENCOUNTER — Encounter: Payer: Self-pay | Admitting: Physician Assistant

## 2017-09-15 DIAGNOSIS — K621 Rectal polyp: Secondary | ICD-10-CM | POA: Diagnosis not present

## 2017-09-15 DIAGNOSIS — Z1211 Encounter for screening for malignant neoplasm of colon: Secondary | ICD-10-CM | POA: Diagnosis not present

## 2017-09-15 LAB — HM COLONOSCOPY

## 2017-09-28 ENCOUNTER — Other Ambulatory Visit: Payer: Self-pay | Admitting: Physician Assistant

## 2017-09-28 DIAGNOSIS — D1801 Hemangioma of skin and subcutaneous tissue: Secondary | ICD-10-CM | POA: Diagnosis not present

## 2017-09-28 DIAGNOSIS — D485 Neoplasm of uncertain behavior of skin: Secondary | ICD-10-CM | POA: Diagnosis not present

## 2017-09-28 DIAGNOSIS — L579 Skin changes due to chronic exposure to nonionizing radiation, unspecified: Secondary | ICD-10-CM | POA: Diagnosis not present

## 2017-09-28 DIAGNOSIS — L814 Other melanin hyperpigmentation: Secondary | ICD-10-CM | POA: Diagnosis not present

## 2017-09-28 DIAGNOSIS — L821 Other seborrheic keratosis: Secondary | ICD-10-CM | POA: Diagnosis not present

## 2017-10-02 ENCOUNTER — Telehealth: Payer: Self-pay | Admitting: Physician Assistant

## 2017-10-02 NOTE — Telephone Encounter (Signed)
Chart updated

## 2017-10-02 NOTE — Telephone Encounter (Signed)
Thank you :)

## 2017-10-02 NOTE — Telephone Encounter (Signed)
Pt called. She is annoyed and wants the calls to stop. She keeps getting automated recordings to have her Colonoscopy done, [facility] I [facility] that office and I was told that the report  is in Care Everywhere.

## 2017-10-19 ENCOUNTER — Other Ambulatory Visit: Payer: Self-pay | Admitting: Physician Assistant

## 2017-10-22 ENCOUNTER — Other Ambulatory Visit: Payer: Self-pay

## 2017-10-27 ENCOUNTER — Other Ambulatory Visit: Payer: Self-pay | Admitting: Physician Assistant

## 2017-10-27 ENCOUNTER — Other Ambulatory Visit: Payer: Self-pay

## 2017-10-27 DIAGNOSIS — F419 Anxiety disorder, unspecified: Secondary | ICD-10-CM

## 2017-10-27 DIAGNOSIS — E039 Hypothyroidism, unspecified: Secondary | ICD-10-CM

## 2017-10-27 DIAGNOSIS — F5101 Primary insomnia: Secondary | ICD-10-CM

## 2017-10-27 MED ORDER — THYROID 120 MG PO TABS: ORAL_TABLET | ORAL | 0 refills | Status: DC

## 2017-10-27 MED ORDER — CLONAZEPAM 0.5 MG PO TABS: ORAL_TABLET | ORAL | 3 refills | Status: DC

## 2017-10-27 MED ORDER — TRAZODONE HCL 50 MG PO TABS: ORAL_TABLET | ORAL | 5 refills | Status: DC

## 2017-10-27 NOTE — Telephone Encounter (Signed)
Pt requesting refills. Due for TSH labs. Orders pended.

## 2017-10-28 ENCOUNTER — Other Ambulatory Visit: Payer: Self-pay

## 2017-10-28 ENCOUNTER — Encounter: Payer: Self-pay | Admitting: Physician Assistant

## 2017-10-28 DIAGNOSIS — E039 Hypothyroidism, unspecified: Secondary | ICD-10-CM

## 2017-10-29 ENCOUNTER — Encounter: Payer: Self-pay | Admitting: Physician Assistant

## 2017-11-02 DIAGNOSIS — E039 Hypothyroidism, unspecified: Secondary | ICD-10-CM | POA: Diagnosis not present

## 2017-11-03 ENCOUNTER — Encounter: Payer: Self-pay | Admitting: Physician Assistant

## 2017-11-03 LAB — TSH: TSH: 9.36 m[IU]/L — AB (ref 0.40–4.50)

## 2017-11-03 MED ORDER — THYROID 30 MG PO TABS: 30.0000 mg | ORAL_TABLET | Freq: Every day | ORAL | 1 refills | Status: DC

## 2017-11-03 NOTE — Addendum Note (Signed)
Addended by: Jomarie Longs on: 11/03/2017 07:59 AM   Modules accepted: Orders

## 2017-11-03 NOTE — Progress Notes (Signed)
Almost there to normal range. Adding a 30mg  to the 120mg  you are already on of thyroid medication. Recheck labs in 6 weeks.

## 2017-11-04 ENCOUNTER — Encounter: Payer: Self-pay | Admitting: Physician Assistant

## 2017-11-04 NOTE — Progress Notes (Signed)
If she feels fine she can stay at same dose. She is close to optimal.

## 2017-11-05 ENCOUNTER — Encounter: Payer: Self-pay | Admitting: Physician Assistant

## 2017-11-05 ENCOUNTER — Ambulatory Visit (INDEPENDENT_AMBULATORY_CARE_PROVIDER_SITE_OTHER): Payer: Federal, State, Local not specified - PPO | Admitting: Physician Assistant

## 2017-11-05 VITALS — BP 136/81 | HR 73 | Temp 97.8°F | Wt 221.0 lb

## 2017-11-05 DIAGNOSIS — M545 Low back pain, unspecified: Secondary | ICD-10-CM

## 2017-11-05 DIAGNOSIS — N3000 Acute cystitis without hematuria: Secondary | ICD-10-CM

## 2017-11-05 DIAGNOSIS — R350 Frequency of micturition: Secondary | ICD-10-CM | POA: Diagnosis not present

## 2017-11-05 DIAGNOSIS — R82998 Other abnormal findings in urine: Secondary | ICD-10-CM | POA: Diagnosis not present

## 2017-11-05 LAB — POCT URINALYSIS DIPSTICK
BILIRUBIN UA: NEGATIVE
Blood, UA: NEGATIVE
GLUCOSE UA: NEGATIVE
Ketones, UA: NEGATIVE
Nitrite, UA: NEGATIVE
Protein, UA: NEGATIVE
Spec Grav, UA: 1.025 (ref 1.010–1.025)
Urobilinogen, UA: 0.2 E.U./dL
pH, UA: 5.5 (ref 5.0–8.0)

## 2017-11-05 MED ORDER — NITROFURANTOIN MONOHYD MACRO 100 MG PO CAPS: 100.0000 mg | ORAL_CAPSULE | Freq: Two times a day (BID) | ORAL | 0 refills | Status: AC

## 2017-11-05 NOTE — Patient Instructions (Signed)

## 2017-11-05 NOTE — Progress Notes (Signed)
HPI:                                                                Kristy Jones is a 62 y.o. female [facility] today for urinary frequency  Several days of bilateral low back pain, mild, persistent, does not radiate. Developed suprapubic pressure and urinary frequency yesterday Denies fever, chills, nausea/vomiting Denies history of pyelonephritis   Past Medical History:  Diagnosis Date  . Lichen sclerosus   . Thyroid disease    Past Surgical History:  Procedure Laterality Date  . c-sections    . SEPTOPLASTY     Social History   Tobacco Use  . Smoking status: Former Smoker    Types: Cigarettes    Last attempt to quit: 06/06/2007    Years since quitting: 10.4  . Smokeless tobacco: Never Used  Substance Use Topics  . Alcohol use: Yes    Alcohol/week: 6.0 standard drinks    Types: 6 Standard drinks or equivalent per week   family history includes Alcohol abuse in her unknown relative; Depression in her mother; Prostate cancer in her father.    ROS: negative except as noted in the HPI  Medications: Current Outpatient Medications  Medication Sig Dispense Refill  . Adapalene 0.3 % gel APPLY TOPICALLY EVERY NIGHT AT BEDTIME 45 g 0  . AMBULATORY NON FORMULARY MEDICATION Take 600 mg by mouth daily. Tumeric Take one- two  tablet daily.     . AMBULATORY NON FORMULARY MEDICATION Iodine    . Ascorbic Acid (VITAMIN C) 1000 MG tablet Take 1,000 mg by mouth daily.    Marland Kitchen. b complex vitamins capsule Take 1 capsule by mouth daily.    Marland Kitchen. BIOTIN PO Take by mouth.    . Cholecalciferol (VITAMIN D-3 PO) Take 2,000 Units by mouth daily.     . clonazePAM (KLONOPIN) 0.5 MG tablet TAKE 1 TABLET(0.5 MG) BY MOUTH TWICE DAILY AS NEEDED 30 tablet 3  . hydrochlorothiazide (HYDRODIURIL) 25 MG tablet TAKE 1 TABLET BY MOUTH EVERY DAY FOR PERIPHERAL EDEMA 30 tablet 0  . ibuprofen (ADVIL,MOTRIN) 800 MG tablet TAKE 1 TABLET(800 MG) BY  MOUTH EVERY 8 HOURS AS NEEDED 60 tablet 3  . L-LYSINE PO Take by mouth.    . Magnesium 500 MG CAPS Take by mouth. Take one in the am and one in the pm    . nitrofurantoin, macrocrystal-monohydrate, (MACROBID) 100 MG capsule Take 1 capsule (100 mg total) by mouth 2 (two) times daily for 5 days. 10 capsule 0  . NP THYROID 120 MG tablet TAKE 1 TABLET(120 MG) BY MOUTH DAILY BEFORE BREAKFAST 30 tablet 0  . Omega-3 Fatty Acids (FISH OIL) 1000 MG CAPS Take by mouth.    . Selenium 100 MCG TABS Take 100 mcg by mouth daily.    Marland Kitchen. thyroid (NP THYROID) 120 MG tablet TAKE 1 TABLET(120 MG) BY MOUTH DAILY BEFORE BREAKFAST 30 tablet 0  . thyroid (NP THYROID) 30 MG tablet Take 1 tablet (30 mg total) by mouth daily before breakfast. 30 tablet 1  . traZODone (DESYREL) 50 MG tablet TAKE 1 TABLET(50 MG) BY MOUTH AT BEDTIME 30 tablet 5  . venlafaxine XR (EFFEXOR-XR) 37.5 MG 24 hr capsule Take 1 capsule (37.5  mg total) by mouth daily with breakfast. 90 capsule 4  . zinc gluconate 50 MG tablet Take 25 mg by mouth daily.      No current facility-administered medications for this visit.    Allergies  Allergen Reactions  . Tetracyclines & Related Other (See Comments)    Yeast infection  . Vicodin [Hydrocodone-Acetaminophen] Nausea Only       Objective:  BP 136/81   Pulse 73   Temp 97.8 F (36.6 C) (Oral)   Wt 221 lb (100.2 kg)   BMI 34.61 kg/m  Gen:  alert, not ill-appearing, no distress, appropriate for age HEENT: head normocephalic without obvious abnormality, conjunctiva and cornea clear, trachea midline Pulm: Normal work of breathing, normal phonation GI: abdomen soft, nontender, no CVA tenderness Neuro: alert and oriented x 3, no tremor MSK: extremities atraumatic, normal gait and station Skin: intact, no rashes on exposed skin, no jaundice, no cyanosis   Results for orders placed or performed in visit on 11/05/17 (from the past 72 hour(s))  POCT Urinalysis Dipstick     Status: Abnormal    Collection Time: 11/05/17  9:19 AM  Result Value Ref Range   Color, UA yellow    Clarity, UA slightly cloudy    Glucose, UA Negative Negative   Bilirubin, UA negative    Ketones, UA negative    Spec Grav, UA 1.025 1.010 - 1.025   Blood, UA negative    pH, UA 5.5 5.0 - 8.0   Protein, UA Negative Negative   Urobilinogen, UA 0.2 0.2 or 1.0 E.U./dL   Nitrite, UA negative    Leukocytes, UA Trace (A) Negative   Appearance     Odor     No results found.    Assessment and Plan: 62 y.o. female with   .Kristy Jones was seen today for urinary frequency.  Diagnoses and all orders for this visit:  Urinary frequency -     POCT Urinalysis Dipstick -     Urine Culture -     nitrofurantoin, macrocrystal-monohydrate, (MACROBID) 100 MG capsule; Take 1 capsule (100 mg total) by mouth 2 (two) times daily for 5 days.  Urine leukocytes -     POCT Urinalysis Dipstick -     Urine Culture -     nitrofurantoin, macrocrystal-monohydrate, (MACROBID) 100 MG capsule; Take 1 capsule (100 mg total) by mouth 2 (two) times daily for 5 days.  Acute bilateral low back pain without sciatica   - afebrile, no tachycardia, no CVA tenderness - UA positive for trace leuks, urine cx pending - treating empirically for uncomplicated cystitis with Macrobid - OTC analgesic of choice as needed for LBP and suprapubic discomfort  Patient education and anticipatory guidance given Patient agrees with treatment plan Follow-up as needed if symptoms worsen or fail to improve  Levonne Hubert PA-C

## 2017-11-07 LAB — URINE CULTURE
MICRO NUMBER: 91094655
SPECIMEN QUALITY:: ADEQUATE

## 2017-11-08 ENCOUNTER — Encounter: Payer: Self-pay | Admitting: Physician Assistant

## 2017-11-08 MED ORDER — CEFUROXIME AXETIL 250 MG PO TABS: 250.0000 mg | ORAL_TABLET | Freq: Two times a day (BID) | ORAL | 0 refills | Status: AC

## 2017-11-08 NOTE — Addendum Note (Signed)
Addended by: Gena FrayUMMINGS, CHARLEY E on: 11/08/2017 03:05 PM   Modules accepted: Orders

## 2017-11-09 ENCOUNTER — Encounter: Payer: Self-pay | Admitting: Physician Assistant

## 2017-12-17 ENCOUNTER — Ambulatory Visit (INDEPENDENT_AMBULATORY_CARE_PROVIDER_SITE_OTHER): Admitting: Family Medicine

## 2017-12-17 ENCOUNTER — Encounter: Payer: Self-pay | Admitting: Family Medicine

## 2017-12-17 VITALS — BP 142/57 | HR 68 | Ht 67.0 in | Wt 227.0 lb

## 2017-12-17 DIAGNOSIS — M533 Sacrococcygeal disorders, not elsewhere classified: Secondary | ICD-10-CM | POA: Diagnosis not present

## 2017-12-17 DIAGNOSIS — M7061 Trochanteric bursitis, right hip: Secondary | ICD-10-CM | POA: Diagnosis not present

## 2017-12-17 NOTE — Progress Notes (Signed)
Kristy Jones is a 62 y.o. female who presents to [facility] today for right lateral hip pain.  Kristy Jones notes pain in her right lateral hip occurring over the last few weeks.  She denies any injury weakness or numbness.  Pain is located in the lateral hip worse with standing from a seated position climbing stairs and laying on her right side.  She has been increasing her activity recently after she has retired and effort to improve her physical fitness.  She notes some pain in her low back as well consistent with previous episodes of SI joint dysfunction.  She notes her lateral hip pain is similar to previous episodes of trochanteric bursitis that she is had the past.  She has not been treating it with physical therapy or exercises.  She tried a couple over-the-counter medications which helped a little.    She was last seen in June for SI joint pain where she received an injection which worked quite well and still is helping her low back pain.    ROS:  As above  Exam:  BP (!) 142/57   Pulse 68   Ht 5\' 7"  (1.702 m)   Wt 227 lb (103 kg)   BMI 35.55 kg/m  General: Well Developed, well nourished, and in no acute distress.  Neuro/Psych: Alert and oriented x3, extra-ocular muscles intact, able to move all 4 extremities, sensation grossly intact. Skin: Warm and dry, no rashes noted.  Respiratory: Not using accessory muscles, speaking in full sentences, trachea midline.  Cardiovascular: Pulses palpable, no extremity edema. Abdomen: Does not appear distended. MSK:  Right hip normal-appearing normal motion. Tender palpation greater trochanter. Hip abduction strength diminished 4/5. Hip rotation strength normal internal and external.  Left hip normal-appearing nontender. Hip abduction strength and rotation strength are intact.  Mild antalgic gait.  L-spine: Nontender to spinal midline normal lumbar motion. Tender to palpation right SI  joint.       Assessment and Plan: 62 y.o. female with right lateral hip pain likely trochanteric bursitis.  Plan for physical therapy, home exercise program, and advance activity as tolerated.  Recheck in 6 to 8 weeks.  Return sooner if needed.  Prescription strength over-the-counter NSAIDs as needed for pain.  I spent 25 minutes with this patient, greater than 50% was face-to-face time counseling regarding ddx, plan and HEP teaching and discussion.     Orders Placed This Encounter  Procedures  . Ambulatory referral to Physical Therapy    Referral Priority:   Routine    Referral Type:   Physical Medicine    Referral Reason:   Specialty Services Required    Requested Specialty:   Physical Therapy   No orders of the defined types were placed in this encounter.   Historical information moved to improve visibility of documentation.  Past Medical History:  Diagnosis Date  . Lichen sclerosus   . Thyroid disease    Past Surgical History:  Procedure Laterality Date  . c-sections    . SEPTOPLASTY     Social History   Tobacco Use  . Smoking status: Former Smoker    Types: Cigarettes    Last attempt to quit: 06/06/2007    Years since quitting: 10.5  . Smokeless tobacco: Never Used  Substance Use Topics  . Alcohol use: Yes    Alcohol/week: 6.0 standard drinks    Types: 6 Standard drinks or equivalent per week   family history includes Alcohol abuse in her unknown relative;  Depression in her mother; Prostate cancer in her father.  Medications: Current Outpatient Medications  Medication Sig Dispense Refill  . Adapalene 0.3 % gel APPLY TOPICALLY EVERY NIGHT AT BEDTIME 45 g 0  . AMBULATORY NON FORMULARY MEDICATION Take 600 mg by mouth daily. Tumeric Take one- two  tablet daily.     . AMBULATORY NON FORMULARY MEDICATION Iodine    . Ascorbic Acid (VITAMIN C) 1000 MG tablet Take 1,000 mg by mouth daily.    Marland Kitchen b complex vitamins capsule Take 1 capsule by mouth daily.    Marland Kitchen  BIOTIN PO Take by mouth.    . Cholecalciferol (VITAMIN D-3 PO) Take 2,000 Units by mouth daily.     . clonazePAM (KLONOPIN) 0.5 MG tablet TAKE 1 TABLET(0.5 MG) BY MOUTH TWICE DAILY AS NEEDED 30 tablet 3  . hydrochlorothiazide (HYDRODIURIL) 25 MG tablet TAKE 1 TABLET BY MOUTH EVERY DAY FOR PERIPHERAL EDEMA 30 tablet 0  . ibuprofen (ADVIL,MOTRIN) 800 MG tablet TAKE 1 TABLET(800 MG) BY MOUTH EVERY 8 HOURS AS NEEDED 60 tablet 3  . L-LYSINE PO Take by mouth.    . Magnesium 500 MG CAPS Take by mouth. Take one in the am and one in the pm    . NP THYROID 120 MG tablet TAKE 1 TABLET(120 MG) BY MOUTH DAILY BEFORE BREAKFAST 30 tablet 0  . Omega-3 Fatty Acids (FISH OIL) 1000 MG CAPS Take by mouth.    . Selenium 100 MCG TABS Take 100 mcg by mouth daily.    Marland Kitchen thyroid (NP THYROID) 120 MG tablet TAKE 1 TABLET(120 MG) BY MOUTH DAILY BEFORE BREAKFAST 30 tablet 0  . thyroid (NP THYROID) 30 MG tablet Take 1 tablet (30 mg total) by mouth daily before breakfast. 30 tablet 1  . traZODone (DESYREL) 50 MG tablet TAKE 1 TABLET(50 MG) BY MOUTH AT BEDTIME 30 tablet 5  . venlafaxine XR (EFFEXOR-XR) 37.5 MG 24 hr capsule Take 1 capsule (37.5 mg total) by mouth daily with breakfast. 90 capsule 4  . zinc gluconate 50 MG tablet Take 25 mg by mouth daily.      No current facility-administered medications for this visit.    Allergies  Allergen Reactions  . Tetracyclines & Related Other (See Comments)    Yeast infection  . Vicodin [Hydrocodone-Acetaminophen] Nausea Only      Discussed warning signs or symptoms. Please see discharge instructions. Patient expresses understanding.

## 2017-12-17 NOTE — Patient Instructions (Signed)
Thank you for coming in today. Attend PT Work on side leg raises and the stretch.  Do 30 reps 2x daily  Recheck with me in about 8 weeks.    Trochanteric Bursitis Rehab Ask your health care provider which exercises are safe for you. Do exercises exactly as told by your health care provider and adjust them as directed. It is normal to feel mild stretching, pulling, tightness, or discomfort as you do these exercises, but you should stop right away if you feel sudden pain or your pain gets worse.Do not begin these exercises until told by your health care provider. Stretching exercises These exercises warm up your muscles and joints and improve the movement and flexibility of your hip. These exercises also help to relieve pain and stiffness. Exercise A: Iliotibial band stretch  1. Lie on your side with your left / right leg in the top position. 2. Bend your left / right knee and grab your ankle. 3. Slowly bring your knee back so your thigh is behind your body. 4. Slowly lower your knee toward the floor until you feel a gentle stretch on the outside of your left / right thigh. If you do not feel a stretch and your knee will not fall farther, place the heel of your other foot on top of your outer knee and pull your thigh down farther. 5. Hold this position for __________ seconds. 6. Slowly return to the starting position. Repeat __________ times. Complete this exercise __________ times a day. Strengthening exercises These exercises build strength and endurance in your hip and pelvis. Endurance is the ability to use your muscles for a long time, even after they get tired. Exercise B: Bridge ( hip extensors) 1. Lie on your back on a firm surface with your knees bent and your feet flat on the floor. 2. Tighten your buttocks muscles and lift your buttocks off the floor until your trunk is level with your thighs. You should feel the muscles working in your buttocks and the back of your thighs. If  this exercise is too easy, try doing it with your arms crossed over your chest. 3. Hold this position for __________ seconds. 4. Slowly return to the starting position. 5. Let your muscles relax completely between repetitions. Repeat __________ times. Complete this exercise __________ times a day. Exercise C: Squats ( knee extensors and  quadriceps) 1. Stand in front of a table, with your feet and knees pointing straight ahead. You may rest your hands on the table for balance but not for support. 2. Slowly bend your knees and lower your hips like you are going to sit in a chair. ? Keep your weight over your heels, not over your toes. ? Keep your lower legs upright so they are parallel with the table legs. ? Do not let your hips go lower than your knees. ? Do not bend lower than told by your health care provider. ? If your hip pain increases, do not bend as low. 3. Hold this position for __________ seconds. 4. Slowly push with your legs to return to standing. Do not use your hands to pull yourself to standing. Repeat __________ times. Complete this exercise __________ times a day. Exercise D: Hip hike 1. Stand sideways on a bottom step. Stand on your left / right leg with your other foot unsupported next to the step. You can hold onto the railing or wall if needed for balance. 2. Keeping your knees straight and your torso square, lift your  left / right hip up toward the ceiling. 3. Hold this position for __________ seconds. 4. Slowly let your left / right hip lower toward the floor, past the starting position. Your foot should get closer to the floor. Do not lean or bend your knees. Repeat __________ times. Complete this exercise __________ times a day. Exercise E: Single leg stand 1. Stand near a counter or door frame that you can hold onto for balance as needed. It is helpful to stand in front of a mirror for this exercise so you can watch your hip. 2. Squeeze your left / right buttock  muscles then lift up your other foot. Do not let your left / right hip push out to the side. 3. Hold this position for __________ seconds. Repeat __________ times. Complete this exercise __________ times a day. This information is not intended to replace advice given to you by your health care provider. Make sure you discuss any questions you have with your health care provider. Document Released: 03/20/2004 Document Revised: 10/18/2015 Document Reviewed: 01/26/2015 Elsevier Interactive Patient Education  [employer].

## 2017-12-26 ENCOUNTER — Other Ambulatory Visit: Payer: Self-pay | Admitting: Physician Assistant

## 2017-12-26 DIAGNOSIS — E039 Hypothyroidism, unspecified: Secondary | ICD-10-CM

## 2017-12-28 ENCOUNTER — Other Ambulatory Visit: Payer: Self-pay

## 2017-12-29 ENCOUNTER — Telehealth: Payer: Self-pay | Admitting: Physician Assistant

## 2017-12-29 NOTE — Telephone Encounter (Signed)
Pt called and canceled her appt with Dr.Corey on 02/10/18 because she states she left his assistant a voicemail on 10/24 and never got a return call back and she states that she feels like she is not cared about because she cant get a phone call back. I did apologize to pt but she wanted me to let Dr.Corey know. She also states that this has happened before while leaving a message for Jade's assistant so she plans to look for care somewhere else since she cant get return phone calls from the assistants.

## 2017-12-29 NOTE — Telephone Encounter (Signed)
I called patient. She is upset. I encouraged her to use mychart for messaging. She said it has happened more with calling Dr. Denyse Jones. I explained I will bring up in staff mtg today. She would like to stay a patient with myself but look outside the office for ortho care.

## 2017-12-30 NOTE — Telephone Encounter (Signed)
Toniann Fail is looking into this further.

## 2018-01-08 DIAGNOSIS — K080 Exfoliation of teeth due to systemic causes: Secondary | ICD-10-CM | POA: Diagnosis not present

## 2018-01-13 DIAGNOSIS — K080 Exfoliation of teeth due to systemic causes: Secondary | ICD-10-CM | POA: Diagnosis not present

## 2018-01-31 ENCOUNTER — Other Ambulatory Visit: Payer: Self-pay | Admitting: Physician Assistant

## 2018-02-02 ENCOUNTER — Other Ambulatory Visit: Payer: Self-pay | Admitting: Physician Assistant

## 2018-02-02 DIAGNOSIS — E039 Hypothyroidism, unspecified: Secondary | ICD-10-CM

## 2018-02-05 ENCOUNTER — Encounter: Payer: Self-pay | Admitting: Physician Assistant

## 2018-02-06 ENCOUNTER — Other Ambulatory Visit: Payer: Self-pay | Admitting: Physician Assistant

## 2018-02-10 ENCOUNTER — Ambulatory Visit: Payer: Self-pay | Admitting: Family Medicine

## 2018-02-15 ENCOUNTER — Other Ambulatory Visit: Payer: Self-pay | Admitting: Physician Assistant

## 2018-02-15 DIAGNOSIS — F419 Anxiety disorder, unspecified: Secondary | ICD-10-CM

## 2018-02-18 ENCOUNTER — Encounter: Payer: Self-pay | Admitting: Physician Assistant

## 2018-03-01 ENCOUNTER — Other Ambulatory Visit: Payer: Self-pay | Admitting: Physician Assistant

## 2018-03-02 ENCOUNTER — Encounter: Payer: Self-pay | Admitting: Physician Assistant

## 2018-03-02 ENCOUNTER — Other Ambulatory Visit: Payer: Self-pay | Admitting: Physician Assistant

## 2018-03-02 DIAGNOSIS — E039 Hypothyroidism, unspecified: Secondary | ICD-10-CM

## 2018-03-02 DIAGNOSIS — K080 Exfoliation of teeth due to systemic causes: Secondary | ICD-10-CM | POA: Diagnosis not present

## 2018-03-02 MED ORDER — THYROID 30 MG PO TABS: 30.0000 mg | ORAL_TABLET | Freq: Every day | ORAL | 0 refills | Status: DC

## 2018-03-02 MED ORDER — THYROID 120 MG PO TABS: 120.0000 mg | ORAL_TABLET | Freq: Every day | ORAL | 0 refills | Status: DC

## 2018-03-23 ENCOUNTER — Other Ambulatory Visit: Payer: Self-pay | Admitting: Family Medicine

## 2018-03-23 DIAGNOSIS — F419 Anxiety disorder, unspecified: Secondary | ICD-10-CM

## 2018-03-23 NOTE — Telephone Encounter (Signed)
Last RF 02/18/18  Last OV with PCP 08/31/17  RX pended, added note that pt needs appt with PCP.

## 2018-04-05 ENCOUNTER — Encounter: Payer: Self-pay | Admitting: Physician Assistant

## 2018-04-05 ENCOUNTER — Other Ambulatory Visit: Payer: Self-pay | Admitting: Physician Assistant

## 2018-04-05 DIAGNOSIS — E039 Hypothyroidism, unspecified: Secondary | ICD-10-CM

## 2018-04-06 NOTE — Telephone Encounter (Signed)
Ok to send TSH and free T4.

## 2018-04-20 DIAGNOSIS — E039 Hypothyroidism, unspecified: Secondary | ICD-10-CM | POA: Diagnosis not present

## 2018-04-21 ENCOUNTER — Other Ambulatory Visit: Payer: Self-pay | Admitting: Physician Assistant

## 2018-04-21 DIAGNOSIS — F419 Anxiety disorder, unspecified: Secondary | ICD-10-CM

## 2018-04-21 LAB — T4, FREE: Free T4: 1.1 ng/dL (ref 0.8–1.8)

## 2018-04-21 LAB — TSH+FREE T4: TSH W/REFLEX TO FT4: 0.06 m[IU]/L — AB (ref 0.40–4.50)

## 2018-04-21 MED ORDER — CLONAZEPAM 0.5 MG PO TABS: 0.5000 mg | ORAL_TABLET | Freq: Two times a day (BID) | ORAL | 0 refills | Status: DC | PRN

## 2018-04-21 NOTE — Telephone Encounter (Signed)
Call pt: she is "hyper" thyroid than last time. We need to decrease your thyroid dose. Confirm what dose she is taking?

## 2018-04-23 ENCOUNTER — Encounter: Payer: Self-pay | Admitting: Physician Assistant

## 2018-04-23 NOTE — Telephone Encounter (Signed)
I know it is frustrating not to get to a dose and stay there. In sept you were hypothyroid at just 120mg  so I don't want to go all the way back down to 120mg . Lets do 120mg  every day and alternate 30mg  every other day.   Does that make sense?  Recheck in 6 weeks.

## 2018-04-28 ENCOUNTER — Other Ambulatory Visit: Payer: Self-pay | Admitting: Physician Assistant

## 2018-04-28 DIAGNOSIS — E039 Hypothyroidism, unspecified: Secondary | ICD-10-CM

## 2018-05-01 DIAGNOSIS — Z87891 Personal history of nicotine dependence: Secondary | ICD-10-CM | POA: Diagnosis not present

## 2018-05-01 DIAGNOSIS — W010XXA Fall on same level from slipping, tripping and stumbling without subsequent striking against object, initial encounter: Secondary | ICD-10-CM | POA: Diagnosis not present

## 2018-05-01 DIAGNOSIS — M25521 Pain in right elbow: Secondary | ICD-10-CM | POA: Diagnosis not present

## 2018-05-01 DIAGNOSIS — E079 Disorder of thyroid, unspecified: Secondary | ICD-10-CM | POA: Diagnosis not present

## 2018-05-01 DIAGNOSIS — Y93K1 Activity, walking an animal: Secondary | ICD-10-CM | POA: Diagnosis not present

## 2018-05-01 DIAGNOSIS — M25531 Pain in right wrist: Secondary | ICD-10-CM | POA: Diagnosis not present

## 2018-05-01 DIAGNOSIS — G8911 Acute pain due to trauma: Secondary | ICD-10-CM | POA: Diagnosis not present

## 2018-05-01 DIAGNOSIS — Z79899 Other long term (current) drug therapy: Secondary | ICD-10-CM | POA: Diagnosis not present

## 2018-05-01 DIAGNOSIS — Z043 Encounter for examination and observation following other accident: Secondary | ICD-10-CM | POA: Diagnosis not present

## 2018-05-01 DIAGNOSIS — S63501A Unspecified sprain of right wrist, initial encounter: Secondary | ICD-10-CM | POA: Diagnosis not present

## 2018-05-01 DIAGNOSIS — S50311A Abrasion of right elbow, initial encounter: Secondary | ICD-10-CM | POA: Diagnosis not present

## 2018-05-07 DIAGNOSIS — S63501A Unspecified sprain of right wrist, initial encounter: Secondary | ICD-10-CM | POA: Diagnosis not present

## 2018-05-10 ENCOUNTER — Other Ambulatory Visit: Payer: Self-pay | Admitting: Physician Assistant

## 2018-05-11 ENCOUNTER — Encounter: Payer: Self-pay | Admitting: Physician Assistant

## 2018-05-11 DIAGNOSIS — E039 Hypothyroidism, unspecified: Secondary | ICD-10-CM

## 2018-05-11 MED ORDER — THYROID 30 MG PO TABS: 30.0000 mg | ORAL_TABLET | Freq: Every day | ORAL | 0 refills | Status: DC

## 2018-05-11 MED ORDER — IBUPROFEN 800 MG PO TABS: ORAL_TABLET | ORAL | 0 refills | Status: DC

## 2018-05-11 MED ORDER — THYROID 120 MG PO TABS: 120.0000 mg | ORAL_TABLET | Freq: Every day | ORAL | 0 refills | Status: DC

## 2018-05-12 ENCOUNTER — Encounter: Payer: Self-pay | Admitting: Physician Assistant

## 2018-05-19 ENCOUNTER — Other Ambulatory Visit: Payer: Self-pay | Admitting: Physician Assistant

## 2018-05-19 DIAGNOSIS — F419 Anxiety disorder, unspecified: Secondary | ICD-10-CM

## 2018-05-19 NOTE — Telephone Encounter (Signed)
She is aware. I just told her last refill of thyroid medicine. So same for this one.

## 2018-06-07 ENCOUNTER — Ambulatory Visit: Payer: Self-pay | Admitting: Physician Assistant

## 2018-06-08 ENCOUNTER — Encounter: Payer: Self-pay | Admitting: Physician Assistant

## 2018-06-11 ENCOUNTER — Other Ambulatory Visit: Payer: Self-pay | Admitting: Physician Assistant

## 2018-06-11 DIAGNOSIS — F419 Anxiety disorder, unspecified: Secondary | ICD-10-CM

## 2018-06-14 ENCOUNTER — Telehealth (INDEPENDENT_AMBULATORY_CARE_PROVIDER_SITE_OTHER): Payer: Federal, State, Local not specified - PPO | Admitting: Physician Assistant

## 2018-06-14 VITALS — Ht 67.0 in | Wt 232.0 lb

## 2018-06-14 DIAGNOSIS — M25531 Pain in right wrist: Secondary | ICD-10-CM | POA: Diagnosis not present

## 2018-06-14 DIAGNOSIS — J302 Other seasonal allergic rhinitis: Secondary | ICD-10-CM

## 2018-06-14 DIAGNOSIS — F419 Anxiety disorder, unspecified: Secondary | ICD-10-CM

## 2018-06-14 DIAGNOSIS — F5101 Primary insomnia: Secondary | ICD-10-CM | POA: Diagnosis not present

## 2018-06-14 DIAGNOSIS — L700 Acne vulgaris: Secondary | ICD-10-CM

## 2018-06-14 DIAGNOSIS — F430 Acute stress reaction: Secondary | ICD-10-CM | POA: Diagnosis not present

## 2018-06-14 DIAGNOSIS — M25561 Pain in right knee: Secondary | ICD-10-CM

## 2018-06-14 DIAGNOSIS — E039 Hypothyroidism, unspecified: Secondary | ICD-10-CM

## 2018-06-14 MED ORDER — CLONAZEPAM 0.5 MG PO TABS: 0.5000 mg | ORAL_TABLET | Freq: Two times a day (BID) | ORAL | 5 refills | Status: DC | PRN

## 2018-06-14 MED ORDER — ADAPALENE 0.3 % EX GEL: CUTANEOUS | 5 refills | Status: DC

## 2018-06-14 MED ORDER — IBUPROFEN 800 MG PO TABS: ORAL_TABLET | ORAL | 4 refills | Status: DC

## 2018-06-14 MED ORDER — TRAZODONE HCL 100 MG PO TABS: 100.0000 mg | ORAL_TABLET | Freq: Every day | ORAL | 2 refills | Status: DC

## 2018-06-14 NOTE — Progress Notes (Signed)
Patient ID: Kristy Jones, female   DOB: 11/08/1955, 63 y.o.   MRN: [id number] .Virtual Visit via Telephone Note  I connected with Kristy Jones on 06/16/18 at  8:50 AM EDT by telephone and verified that I am speaking with the correct person using two identifiers.   I discussed the limitations, risks, security and privacy concerns of performing an evaluation and management service by telephone and the availability of in person appointments. I also discussed with the patient that there may be a patient responsible charge related to this service. The patient expressed understanding and agreed to proceed.   History of Present Illness: Pt is a 63 yo female with hypothyroidism, anxiety, insomnia who calls into clinic for medication refill.   Her anxiety is worse than usual. She is having a lot worry with the COVID. Her son is having to work in a grocery store. She is having to bring groceries to elderly mother. Her nephew tested positive for covid. She did not have direct contact with him. [name] is really helping her with anxiety she will need refill.  She does admit to not sleeping well.  She struggles to go to sleep.  She is tried melatonin and valerian root with no significant benefit.  Trazodone seemed to help for a few nights but then did not help as much.  She is currently taking clonazepam to sleep.  Patient admits to falling on March 6.  She landed on her right wrist.  She has had significant pain and decreased range of motion since.  She has been seen by urgent care and orthopedic group twice.  They placed her in a brace for a sprain.  X-rays were negative for fracture.  She continues to use anti-inflammatories and ice.  Patient is also having some right knee pain.  She mention this when she saw Dr. Denyse Amassorey for her Workmen's Comp. case but he could not work it up since it did not relate to the case.  No known injury.  Request rx for differen for acne.   Hypothyroidism- recent sent refill.  Agrees to have labs checked at the end of the month. Taking medication daily.   .. Active Ambulatory Problems    Diagnosis Date Noted  . Hypothyroidism 03/08/2009  . 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 Insomnia 02/13/2012  . Anxiety 02/13/2012  . Constipation 07/08/2013  . Hip bursitis 06/14/2014  . 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 DDD (degenerative disc disease), lumbar 06/14/2014  . Right-sided low back pain without sciatica 06/18/2014  . Piriformis syndrome 09/12/2014  . Muscle cramps 11/09/2014  . Edema of right lower extremity 11/09/2014  . Bruising 04/11/2015  . Obesity 04/11/2015  . Abnormal weight gain 04/11/2015  . Eosinophilia 04/13/2015  . SOB (shortness of breath) 07/16/2016  . Bilateral lower extremity edema 07/16/2016  . Weakness 07/16/2016  . Medial epicondylitis, left 06/07/2017  . Seasonal allergies 06/16/2018  . Acne vulgaris 06/16/2018  . Acute pain of right knee 06/16/2018  . Right wrist pain 06/16/2018  . Acute stress reaction 06/16/2018   Resolved Ambulatory Problems    Diagnosis Date Noted  . HAND PAIN, LEFT 03/08/2009  . Acute sinusitis, unspecified 08/12/2010  . Stress at work 02/25/2017   Past Medical History:  Diagnosis Date  . Lichen sclerosus   . Thyroid disease    Reviewed med, allergy, problem list.    Observations/Objective: No acute distress.   .. Today's Vitals   06/14/18 0841  Weight: 232 lb (105.2 kg)  Height:  (1.702 m)   Body mass index is 36.34 kg/m.   .. Depression screen [facility] 2/9 06/14/2018 08/31/2017 06/04/2017 03/16/2017 02/25/2017  Decreased Interest 1 0 1 0 0  Down, Depressed, Hopeless 0 0 0 0 0  PHQ - 2 Score 1 0 1 0 0  Altered sleeping 3 0 1 0 -  Tired, decreased energy 1 0 2 0 -  Change in appetite 0 0 2 0 -  Feeling bad or failure about yourself  0 0 0 0 -  Trouble concentrating 3 0 0 0 -  Moving slowly or fidgety/restless 3 1 0 0 -  Suicidal thoughts 0 0 0 0 -  PHQ-9 Score 0 -  Difficult doing work/chores Not difficult at all Not difficult at all Somewhat difficult - -   .Marland Kitchen GAD 7 : Generalized Anxiety Score 06/14/2018 08/31/2017 06/04/2017 03/16/2017  Nervous, Anxious, on Edge 0  Control/stop worrying 3 0 1 0  Worry too much - different things 3 0 1 0  Trouble relaxing 2 0 1 0  Restless 2 0 1 0  Easily annoyed or irritable 1 0 2 0  Afraid - awful might happen 3 0 0 0  Total GAD 7 Score 0  Anxiety Difficulty Not difficult at all Not difficult at all Somewhat difficult -     Assessment and Plan: Marland KitchenMarland KitchenDiagnoses and all orders for this visit:  Acute stress reaction  Anxiety -     clonazePAM (KLONOPIN) 0.5 MG tablet; Take 1 tablet (0.5 mg total) by mouth 2 (two) times daily as needed for anxiety.  Primary insomnia -     traZODone (DESYREL) 100 MG tablet; Take 1 tablet (100 mg total) by mouth at bedtime.  Right wrist pain -     ibuprofen (ADVIL) 800 MG tablet; TAKE 1 TABLET BY MOUTH EVERY 8 HOURS AS NEEDED  Acute pain of right knee -     ibuprofen (ADVIL) 800 MG tablet; TAKE 1 TABLET BY MOUTH EVERY 8 HOURS AS NEEDED  Acne vulgaris -     Adapalene 0.3 % gel; APPLY TOPICALLY EVERY NIGHT AT BEDTIME  Seasonal allergies  Acquired hypothyroidism  refilled Klonopin.  Encourage patient not to take for sleep.  Encourage patient to take only as needed.  Discussed other treatments for ongoing anxiety.  She declined any daily medication.  We do want her to increase trazodone to 100 mg to see if that would help with sleep.  If no improvement in the next few days we could even increase to 150 mg 1 hour before bedtime.  Discussed good sleep hygiene.  Encourage patient to turn off cell phones and TVs at least 2 hours before bedtime.  Encouraged lavender oils and warm bath before bed.  Keep house cool to promote better sleep.  Certainly increase in anxiety could be hindering sleep.  Discussed counseling.  Patient declines at this time.  Encouraged regular  exercise and meditation.  Follow-up as needed.  Since she has been worked up by Ortho group already I suggest her seeing our sports medicine providers since pain has persisted past 4 weeks.  Differin given for acne.  Marland Kitchen.Spent 30 minutes with patient and greater than 50 percent of visit spent counseling patient regarding treatment plan.    Follow Up Instructions:    I discussed the assessment and treatment plan with the patient. The patient was provided an opportunity to ask questions and all were answered.  The patient agreed with the plan and demonstrated an understanding of the instructions.   The patient was advised to call back or seek an in-person evaluation if the symptoms worsen or if the condition fails to improve as anticipated.  I provided 25 minutes of non-face-to-face time during this encounter.   Tandy Gaw, PA-C

## 2018-06-14 NOTE — Progress Notes (Deleted)
Patient having lots of stress/anxiety. Son is working in Physicist, medical. She is having to bring everything to her elderly mother. Not sleeping well. Trazodone didn't help, taking Valerian Root not helping either. Requesting Clonazepam. PHQ9-GAD7 completed.

## 2018-06-16 ENCOUNTER — Encounter: Payer: Self-pay | Admitting: Physician Assistant

## 2018-06-16 DIAGNOSIS — F430 Acute stress reaction: Secondary | ICD-10-CM | POA: Insufficient documentation

## 2018-06-16 DIAGNOSIS — M25561 Pain in right knee: Secondary | ICD-10-CM | POA: Insufficient documentation

## 2018-06-16 DIAGNOSIS — M25531 Pain in right wrist: Secondary | ICD-10-CM | POA: Insufficient documentation

## 2018-06-16 DIAGNOSIS — J302 Other seasonal allergic rhinitis: Secondary | ICD-10-CM | POA: Insufficient documentation

## 2018-06-16 DIAGNOSIS — L700 Acne vulgaris: Secondary | ICD-10-CM | POA: Insufficient documentation

## 2018-06-21 DIAGNOSIS — E039 Hypothyroidism, unspecified: Secondary | ICD-10-CM | POA: Diagnosis not present

## 2018-06-22 ENCOUNTER — Encounter: Payer: Self-pay | Admitting: Physician Assistant

## 2018-06-22 DIAGNOSIS — E039 Hypothyroidism, unspecified: Secondary | ICD-10-CM

## 2018-06-22 LAB — T4, FREE: Free T4: 1.1 ng/dL (ref 0.8–1.8)

## 2018-06-22 LAB — TSH+FREE T4: TSH W/REFLEX TO FT4: 0.06 mIU/L — ABNORMAL LOW (ref 0.40–4.50)

## 2018-06-23 NOTE — Progress Notes (Signed)
FYI responded through mychart to the patient.

## 2018-06-24 ENCOUNTER — Encounter: Payer: Self-pay | Admitting: Physician Assistant

## 2018-06-24 MED ORDER — THYROID 120 MG PO TABS: ORAL_TABLET | ORAL | 1 refills | Status: DC

## 2018-06-24 NOTE — Telephone Encounter (Signed)
Ok to send lab for [facility] T4 to that lab with a future date of 2 months.

## 2018-06-25 NOTE — Addendum Note (Signed)
Addended by: Mallie Snooks R on: 06/25/2018 01:59 PM   Modules accepted: Orders

## 2018-06-25 NOTE — Telephone Encounter (Signed)
She wants these done at [facility]. Order placed. Routing for it to be faxed.

## 2018-07-09 ENCOUNTER — Ambulatory Visit: Payer: Self-pay | Admitting: Physician Assistant

## 2018-07-09 ENCOUNTER — Encounter: Payer: Self-pay | Admitting: Sports Medicine

## 2018-07-15 ENCOUNTER — Encounter: Payer: Self-pay | Admitting: Physician Assistant

## 2018-07-15 ENCOUNTER — Other Ambulatory Visit: Payer: Self-pay | Admitting: Physician Assistant

## 2018-07-16 ENCOUNTER — Encounter: Payer: Self-pay | Admitting: Physician Assistant

## 2018-08-23 DIAGNOSIS — Z1231 Encounter for screening mammogram for malignant neoplasm of breast: Secondary | ICD-10-CM | POA: Diagnosis not present

## 2018-08-23 DIAGNOSIS — Z6838 Body mass index (BMI) 38.0-38.9, adult: Secondary | ICD-10-CM | POA: Diagnosis not present

## 2018-08-23 DIAGNOSIS — Z01419 Encounter for gynecological examination (general) (routine) without abnormal findings: Secondary | ICD-10-CM | POA: Diagnosis not present

## 2018-08-23 DIAGNOSIS — L900 Lichen sclerosus et atrophicus: Secondary | ICD-10-CM | POA: Diagnosis not present

## 2018-08-24 ENCOUNTER — Telehealth: Payer: Self-pay | Admitting: Neurology

## 2018-08-24 NOTE — Telephone Encounter (Signed)
Received letter from [place] stating patient may have RX for thyroid medication that is in recall from 01/15/2018-07/20/2018. Manufacturer is [employer] and medication was recalled for superpotency.   Spoke with patient and she feels good. No symptoms of hyperthyroid. She is now on 120 mg tablets and those were not affected. Will call with any issues.

## 2018-10-04 ENCOUNTER — Ambulatory Visit (INDEPENDENT_AMBULATORY_CARE_PROVIDER_SITE_OTHER): Payer: Federal, State, Local not specified - PPO | Admitting: Physician Assistant

## 2018-10-04 ENCOUNTER — Ambulatory Visit (INDEPENDENT_AMBULATORY_CARE_PROVIDER_SITE_OTHER): Payer: Federal, State, Local not specified - PPO

## 2018-10-04 ENCOUNTER — Other Ambulatory Visit: Payer: Self-pay

## 2018-10-04 ENCOUNTER — Encounter: Payer: Self-pay | Admitting: Physician Assistant

## 2018-10-04 ENCOUNTER — Ambulatory Visit: Payer: Federal, State, Local not specified - PPO

## 2018-10-04 VITALS — BP 138/82 | HR 71 | Temp 98.2°F | Resp 14 | Wt 245.0 lb

## 2018-10-04 DIAGNOSIS — R0989 Other specified symptoms and signs involving the circulatory and respiratory systems: Secondary | ICD-10-CM

## 2018-10-04 DIAGNOSIS — M79662 Pain in left lower leg: Secondary | ICD-10-CM | POA: Diagnosis not present

## 2018-10-04 DIAGNOSIS — E039 Hypothyroidism, unspecified: Secondary | ICD-10-CM

## 2018-10-04 DIAGNOSIS — J984 Other disorders of lung: Secondary | ICD-10-CM | POA: Diagnosis not present

## 2018-10-04 DIAGNOSIS — I8002 Phlebitis and thrombophlebitis of superficial vessels of left lower extremity: Secondary | ICD-10-CM

## 2018-10-04 DIAGNOSIS — R600 Localized edema: Secondary | ICD-10-CM | POA: Diagnosis not present

## 2018-10-04 DIAGNOSIS — R9389 Abnormal findings on diagnostic imaging of other specified body structures: Secondary | ICD-10-CM

## 2018-10-04 DIAGNOSIS — M7989 Other specified soft tissue disorders: Secondary | ICD-10-CM

## 2018-10-04 MED ORDER — RIVAROXABAN 10 MG PO TABS: 10.0000 mg | ORAL_TABLET | Freq: Every day | ORAL | 0 refills | Status: DC

## 2018-10-04 NOTE — Progress Notes (Signed)
HPI:                                                                Kristy Jones is a 63 y.o. female [facility] Kristy Jones: Primary Care Sports Medicine today for left leg pain/swelling  Reports painful left leg swelling that has been worse over the last 7-10 days. Pain is namely in the medial calf and medial ankle, worse with direct palpation. She has also had some numbness/altered sensation in her left ankle and describes that it feels tight. She states she went to the beach over a week ago and the beach usually makes her feet swell. She takes HCTZ prn for dependent edema. She states HCTZ helped with her right leg swelling but did nothing for her left leg swelling this time.  She reports she has allergies and takes generic Claritin. Endorses increased white-clear sinus drainage and sputum the last couple of days. Denies fever, chills, malaise, cough, SOB.  Denies chest pain, palpitations.  No piror hx of VTE. Former smoker, quit in distant past (2009).     Past Medical History:  Diagnosis Date  . Lichen sclerosus   . Thyroid disease    Past Surgical History:  Procedure Laterality Date  . c-sections    . SEPTOPLASTY     Social History   Tobacco Use  . Smoking status: Former Smoker    Years: 36.00    Types: Cigarettes    Quit date: 06/06/2007    Years since quitting: 11.3  . Smokeless tobacco: Never Used  Substance Use Topics  . Alcohol use: Yes    Alcohol/week: 6.0 standard drinks    Types: 6 Standard drinks or equivalent per week   family history includes Alcohol abuse in an other family member; Depression in her mother; Prostate cancer in her father.    ROS: negative except as noted in the HPI  Medications: Current Outpatient Medications  Medication Sig Dispense Refill  . Adapalene 0.3 % gel APPLY TOPICALLY EVERY NIGHT AT BEDTIME 45 g 5  . Ascorbic Acid (VITAMIN C) 1000 MG tablet Take 1,000 mg by mouth daily.    Marland Kitchen. b complex vitamins  capsule Take 1 capsule by mouth daily.    Marland Kitchen. BIOTIN PO Take by mouth.    . Cholecalciferol (VITAMIN D-3 PO) Take 2,000 Units by mouth daily.     . clonazePAM (KLONOPIN) 0.5 MG tablet Take 1 tablet (0.5 mg total) by mouth 2 (two) times daily as needed for anxiety. 60 tablet 5  . L-LYSINE PO Take by mouth.    . loratadine (CLARITIN) 10 MG tablet Take 10 mg by mouth daily.    . Magnesium 500 MG CAPS Take by mouth. Take one in the am and one in the pm    . Omega-3 Fatty Acids (FISH OIL) 1000 MG CAPS Take by mouth.    . Selenium 100 MCG TABS Take 100 mcg by mouth daily.    Marland Kitchen. thyroid (NP THYROID) 120 MG tablet TAKE 1 TABLET(120 MG) BY MOUTH DAILY BEFORE BREAKFAST 90 tablet 1  . traZODone (DESYREL) 100 MG tablet Take 1 tablet (100 mg total) by mouth at bedtime. 30 tablet 2  . TURMERIC PO Take by mouth.    Marland Kitchen. VALERIAN PO Take  by mouth.    . zinc gluconate 50 MG tablet Take 25 mg by mouth daily.     . rivaroxaban (XARELTO) 10 MG TABS tablet Take 1 tablet (10 mg total) by mouth daily. 45 tablet 0   No current facility-administered medications for this visit.    Allergies  Allergen Reactions  . Tetracyclines & Related Other (See Comments)    Yeast infection  . Vicodin [Hydrocodone-Acetaminophen] Nausea Only       Objective:  BP 138/82   Pulse 71   Temp 98.2 F (36.8 C) (Oral)   Resp 14   Wt 245 lb (111.1 kg)   SpO2 98%   BMI 38.37 kg/m  Gen:  alert, not ill-appearing, no distress, appropriate for age, obese female HEENT: head normocephalic without obvious abnormality, conjunctiva and cornea clear, trachea midline Pulm: Normal work of breathing, normal phonation, clear to auscultation bilaterally,  right sided coarse expiratory breath sounds and rhonchi in the mid-upper fields CV: Normal rate, regular rhythm, s1 and s2 distinct, no murmurs, clicks or rubs  Neuro: alert and oriented x 3, no tremor MSK: LLE appears mildly swollen compared to right, no pitting, there is tenderness of the  proximal medial calf and medial ankle, no palpable cord, DP and PT pulses intact Skin: intact, spider veins of distal LLE, no other varicosities Psych: well-groomed, cooperative, good eye contact, euthymic mood, affect mood-congruent, speech is articulate, and thought processes clear and goal-directed   Lab Results  Component Value Date   CREATININE 0.63 09/01/2017   BUN 18 09/01/2017   NA 140 09/01/2017   K 4.5 09/01/2017   CL 107 09/01/2017   CO2 27 09/01/2017   Lab Results  Component Value Date   INR 0.92 04/11/2015   No results found for: PTT Lab Results  Component Value Date   CALCIUM 9.3 09/01/2017    Lab Results  Component Value Date   ALT 20 09/01/2017   AST 21 09/01/2017   ALKPHOS 44 04/11/2015   BILITOT 0.4 09/01/2017   Lab Results  Component Value Date   WBC 4.9 09/01/2017   HGB 13.6 09/01/2017   HCT 40.4 09/01/2017   MCV 89.6 09/01/2017   PLT 242 09/01/2017     No results found for this or any previous visit (from the past 72 hour(s)). Dg Chest 2 View  Result Date: 10/04/2018 CLINICAL DATA:  Coarse right-sided breath sounds with left lower extremity swelling and pain EXAM: CHEST - 2 VIEW COMPARISON:  None. FINDINGS: The heart size is normal. There is no large pleural effusion. No focal infiltrate. There is a possible small density overlying the right lung apex medially. There is no pneumothorax. No acute osseous abnormality. IMPRESSION: 1. No definite acute cardiopulmonary process. 2. Small density projecting over the right lung apex of unknown clinical significance. A 4-6 week follow-up two-view chest x-ray is recommended to confirm stability or resolution of this finding. Electronically Signed   By: Kristy Jones M.D.   On: 10/04/2018 16:46   US Venous Img Lower Unilateral Left  Result Date: 10/04/2018 CLINICAL DATA:  Pain and edema of the left lower leg. EXAM: LEFT LOWER EXTREMITY VENOUS DOPPLER ULTRASOUND TECHNIQUE: Gray-scale sonography with graded  compression, as well as color Doppler and duplex ultrasound were performed to evaluate the lower extremity deep venous systems from the level of the common femoral vein and including the common femoral, femoral, profunda femoral, popliteal and calf veins including the posterior tibial, peroneal and gastrocnemius veins when visible. The superficial  great saphenous vein was also interrogated. Spectral Doppler was utilized to evaluate flow at rest and with distal augmentation maneuvers in the common femoral, femoral and popliteal veins. COMPARISON:  None. FINDINGS: Contralateral Common Femoral Vein: Respiratory phasicity is normal and symmetric with the symptomatic side. No evidence of thrombus. Normal compressibility. Common Femoral Vein: No evidence of thrombus. Normal compressibility, respiratory phasicity and response to augmentation. Saphenofemoral Junction: No evidence of thrombus. Normal compressibility and flow on color Doppler imaging. Profunda Femoral Vein: No evidence of thrombus. Normal compressibility and flow on color Doppler imaging. Femoral Vein: No evidence of thrombus. Normal compressibility, respiratory phasicity and response to augmentation. Popliteal Vein: No evidence of thrombus. Normal compressibility, respiratory phasicity and response to augmentation. Calf Veins: No evidence of thrombus. Normal compressibility and flow on color Doppler imaging. Superficial Great Saphenous Vein: There is evidence of superficial thrombophlebitis involving the left great saphenous vein from the level of the knee to the ankle. Venous Reflux:  None. Other Findings:  No abnormal fluid collections identified. IMPRESSION: Superficial thrombophlebitis of the left great saphenous vein from the knee to ankle. No evidence of left lower extremity deep vein thrombosis. Electronically Signed   By: Irish LackGlenn  Yamagata M.D.   On: 10/04/2018 16:48      Assessment and Plan: 63 y.o. female with   .Gunnar Fusiaula was seen today for  edema.  Diagnoses and all orders for this visit:  Pain and swelling of left lower leg -     US Venous Img Lower Unilateral Left -     CP4508-PT/INR AND PTT -     CBC with Differential/Platelet -     COMPLETE METABOLIC PANEL WITH GFR -     Thyroid Panel With TSH -     D-Dimer, Quantitative  Scattered rhonchi of right lung -     DG Chest 2 View  Thrombophlebitis of superficial veins of left lower extremity -     rivaroxaban (XARELTO) 10 MG TABS tablet; Take 1 tablet (10 mg total) by mouth daily. -     Ambulatory referral to Vascular Surgery -     CP4508-PT/INR AND PTT -     CBC with Differential/Platelet -     COMPLETE METABOLIC PANEL WITH GFR -     D-Dimer, Quantitative  Abnormal finding on chest xray -     DG Chest 2 View; Future  Acquired hypothyroidism -     Thyroid Panel With TSH  Patient afebrile, no tachypnea, no tachycarida, pulse ox 98% on RA at rest, BP in stage 1 hypertensive range, coarse breath sounds in the right upper lung fields Very low clinical suspicion for acute PE Stat Venous US was ordered to r/o DVT  Venous US shows extensive superficial thrombophlebitis of the left great saphenous vein from the knee to ankle For this reason, starting patient on anticoagulation with Xarelto and referring to vascular surgery for follow-up She will have renal function checked tomorrow Will also obtain baseline d-dimer to trend and re-assess thrombus burden Patient was counseled on risks of anticoagulation including severe bleeding She was counseled on supportive care of thrombophlebitis to include heat, compression and elevation She was instructed to avoid NSAIDs  Chest x-ray also showed a nonspecific density in her right apex. She was instructed to self-monitor for new or worsening respiratory symptoms and repeat CXR in 1 month.  Patient education and anticipatory guidance given Patient agrees with treatment plan Follow-up as needed if symptoms worsen or fail to  improve  I spent 40 minutes  with this patient, greater than 50% was face-to-face time counseling regarding the above diagnoses  Levonne Hubertharley E. Nashaun Hillmer PA-C

## 2018-10-04 NOTE — Progress Notes (Signed)
Hi Kristy Jones,  As we discussed on the phone, your ultrasound shows superficial thrombophlebitis of your great saphenous vein.  Due to the size and location of this superficial blood clot, I am going to start you on an anticoagulant called Xarelto. Take 10 mg once daily for 45 days. It may require a PA with your insurance.  Stop Ibuprofen and avoid all OTC anti-inflammatories/NSAIDs and herbal supplements. You can take Tylenol.  Go to lab tomorrow to check your kidney function and other labs  Also pick up some knee high medium compression stockings from the local medical supply store. I can write you a prescription if needed. You should elevate your leg as much as possible. You can apply warm compress for 15-20 minutes several times per day as needed  Lastly, I referred you to [facility] [place] for follow-up

## 2018-10-05 ENCOUNTER — Encounter: Payer: Self-pay | Admitting: Physician Assistant

## 2018-10-05 DIAGNOSIS — I8002 Phlebitis and thrombophlebitis of superficial vessels of left lower extremity: Secondary | ICD-10-CM | POA: Diagnosis not present

## 2018-10-05 DIAGNOSIS — E039 Hypothyroidism, unspecified: Secondary | ICD-10-CM | POA: Diagnosis not present

## 2018-10-05 DIAGNOSIS — M7989 Other specified soft tissue disorders: Secondary | ICD-10-CM | POA: Diagnosis not present

## 2018-10-05 DIAGNOSIS — M79662 Pain in left lower leg: Secondary | ICD-10-CM | POA: Diagnosis not present

## 2018-10-05 NOTE — Telephone Encounter (Signed)
See note as an FYI. I handled part of it and the other is information. Please advise.

## 2018-10-06 ENCOUNTER — Encounter: Payer: Self-pay | Admitting: Physician Assistant

## 2018-10-06 LAB — COMPLETE METABOLIC PANEL WITH GFR
AG Ratio: 1.9 (calc) (ref 1.0–2.5)
ALT: 24 U/L (ref 6–29)
AST: 19 U/L (ref 10–35)
Albumin: 4.3 g/dL (ref 3.6–5.1)
Alkaline phosphatase (APISO): 80 U/L (ref 37–153)
BUN: 15 mg/dL (ref 7–25)
CO2: 26 mmol/L (ref 20–32)
Calcium: 9.8 mg/dL (ref 8.6–10.4)
Chloride: 106 mmol/L (ref 98–110)
Creat: 0.7 mg/dL (ref 0.50–0.99)
GFR, Est African American: 108 mL/min/{1.73_m2} (ref >=60)
GFR, Est Non African American: 93 mL/min/{1.73_m2} (ref >=60)
Globulin: 2.3 g/dL (calc) (ref 1.9–3.7)
Glucose, Bld: 133 mg/dL — ABNORMAL HIGH (ref 65–99)
Potassium: 3.9 mmol/L (ref 3.5–5.3)
Sodium: 141 mmol/L (ref 135–146)
Total Bilirubin: 0.6 mg/dL (ref 0.2–1.2)
Total Protein: 6.6 g/dL (ref 6.1–8.1)

## 2018-10-06 LAB — CP4508-PT/INR AND PTT
INR: 1
Prothrombin Time: 10.4 s (ref 9.0–11.5)
aPTT: 27 s (ref 22–34)

## 2018-10-06 LAB — CBC WITH DIFFERENTIAL/PLATELET
Absolute Monocytes: 505 cells/uL (ref 200–950)
Basophils Absolute: 61 cells/uL (ref 0–200)
Basophils Relative: 1.2 %
Eosinophils Absolute: 428 cells/uL (ref 15–500)
Eosinophils Relative: 8.4 %
HCT: 41.6 % (ref 35.0–45.0)
Hemoglobin: 14.2 g/dL (ref 11.7–15.5)
Lymphs Abs: 1637 cells/uL (ref 850–3900)
MCH: 30.7 pg (ref 27.0–33.0)
MCHC: 34.1 g/dL (ref 32.0–36.0)
MCV: 90 fL (ref 80.0–100.0)
MPV: 11.2 fL (ref 7.5–12.5)
Monocytes Relative: 9.9 %
Neutro Abs: 2468 cells/uL (ref 1500–7800)
Neutrophils Relative %: 48.4 %
Platelets: 274 10*3/uL (ref 140–400)
RBC: 4.62 10*6/uL (ref 3.80–5.10)
RDW: 12.1 % (ref 11.0–15.0)
Total Lymphocyte: 32.1 %
WBC: 5.1 10*3/uL (ref 3.8–10.8)

## 2018-10-06 LAB — THYROID PANEL WITH TSH
Free Thyroxine Index: 1.8 (ref 1.4–3.8)
T3 Uptake: 32 % (ref 22–35)
T4, Total: 5.5 ug/dL (ref 5.1–11.9)
TSH: 0.53 mIU/L (ref 0.40–4.50)

## 2018-10-06 LAB — D-DIMER, QUANTITATIVE: D-Dimer, Quant: 1.68 mcg/mL FEU — ABNORMAL HIGH (ref <0.50)

## 2018-10-07 ENCOUNTER — Encounter: Payer: Self-pay | Admitting: Physician Assistant

## 2018-10-08 ENCOUNTER — Other Ambulatory Visit: Payer: Self-pay | Admitting: Physician Assistant

## 2018-10-08 DIAGNOSIS — F5101 Primary insomnia: Secondary | ICD-10-CM

## 2018-10-10 ENCOUNTER — Encounter: Payer: Self-pay | Admitting: Physician Assistant

## 2018-10-11 DIAGNOSIS — I80292 Phlebitis and thrombophlebitis of other deep vessels of left lower extremity: Secondary | ICD-10-CM | POA: Diagnosis not present

## 2018-11-02 DIAGNOSIS — I80292 Phlebitis and thrombophlebitis of other deep vessels of left lower extremity: Secondary | ICD-10-CM | POA: Diagnosis not present

## 2018-11-04 ENCOUNTER — Ambulatory Visit (INDEPENDENT_AMBULATORY_CARE_PROVIDER_SITE_OTHER): Payer: Federal, State, Local not specified - PPO

## 2018-11-04 ENCOUNTER — Other Ambulatory Visit: Payer: Self-pay

## 2018-11-04 DIAGNOSIS — R9389 Abnormal findings on diagnostic imaging of other specified body structures: Secondary | ICD-10-CM | POA: Diagnosis not present

## 2018-11-04 DIAGNOSIS — J984 Other disorders of lung: Secondary | ICD-10-CM | POA: Diagnosis not present

## 2018-11-05 ENCOUNTER — Telehealth: Payer: Self-pay

## 2018-11-05 NOTE — Telephone Encounter (Signed)
Kristy Jones is calling about her xray results.

## 2018-11-09 DIAGNOSIS — I80292 Phlebitis and thrombophlebitis of other deep vessels of left lower extremity: Secondary | ICD-10-CM | POA: Diagnosis not present

## 2018-11-29 ENCOUNTER — Encounter: Payer: Self-pay | Admitting: Physician Assistant

## 2018-11-30 ENCOUNTER — Encounter: Payer: Self-pay | Admitting: Family Medicine

## 2018-11-30 MED ORDER — THYROID 113.75 MG PO TABS: 1.0000 | ORAL_TABLET | Freq: Every day | ORAL | 1 refills | Status: DC

## 2018-11-30 NOTE — Telephone Encounter (Signed)
[facility] pharmacy called to inform clinic that nature throid is currently not available. Please advise. -EH/RMA

## 2018-12-01 MED ORDER — THYROID 60 MG PO TABS: 120.0000 mg | ORAL_TABLET | Freq: Every day | ORAL | 1 refills | Status: DC

## 2018-12-01 NOTE — Addendum Note (Signed)
Addended by: Gregor Hams on: 12/01/2018 07:12 AM   Modules accepted: Orders

## 2018-12-06 DIAGNOSIS — L900 Lichen sclerosus et atrophicus: Secondary | ICD-10-CM | POA: Diagnosis not present

## 2018-12-13 ENCOUNTER — Encounter: Payer: Self-pay | Admitting: Physician Assistant

## 2018-12-13 DIAGNOSIS — L600 Ingrowing nail: Secondary | ICD-10-CM | POA: Diagnosis not present

## 2018-12-13 DIAGNOSIS — M79674 Pain in right toe(s): Secondary | ICD-10-CM | POA: Diagnosis not present

## 2018-12-14 ENCOUNTER — Encounter: Payer: Self-pay | Admitting: Physician Assistant

## 2018-12-14 DIAGNOSIS — F419 Anxiety disorder, unspecified: Secondary | ICD-10-CM

## 2018-12-14 MED ORDER — CLONAZEPAM 0.5 MG PO TABS: 0.5000 mg | ORAL_TABLET | Freq: Two times a day (BID) | ORAL | 0 refills | Status: DC | PRN

## 2018-12-20 ENCOUNTER — Ambulatory Visit: Payer: Federal, State, Local not specified - PPO | Admitting: Family Medicine

## 2019-01-06 ENCOUNTER — Encounter: Payer: Self-pay | Admitting: Physician Assistant

## 2019-01-12 ENCOUNTER — Encounter: Payer: Self-pay | Admitting: Physician Assistant

## 2019-01-12 ENCOUNTER — Ambulatory Visit (INDEPENDENT_AMBULATORY_CARE_PROVIDER_SITE_OTHER): Payer: Federal, State, Local not specified - PPO | Admitting: Physician Assistant

## 2019-01-12 VITALS — Ht 67.0 in | Wt 227.0 lb

## 2019-01-12 DIAGNOSIS — Z13220 Encounter for screening for lipoid disorders: Secondary | ICD-10-CM

## 2019-01-12 DIAGNOSIS — M25561 Pain in right knee: Secondary | ICD-10-CM | POA: Diagnosis not present

## 2019-01-12 DIAGNOSIS — E039 Hypothyroidism, unspecified: Secondary | ICD-10-CM

## 2019-01-12 DIAGNOSIS — F419 Anxiety disorder, unspecified: Secondary | ICD-10-CM | POA: Diagnosis not present

## 2019-01-12 DIAGNOSIS — Z131 Encounter for screening for diabetes mellitus: Secondary | ICD-10-CM

## 2019-01-12 DIAGNOSIS — Z79899 Other long term (current) drug therapy: Secondary | ICD-10-CM

## 2019-01-12 MED ORDER — THYROID 60 MG PO TABS: 120.0000 mg | ORAL_TABLET | Freq: Every day | ORAL | 1 refills | Status: DC

## 2019-01-12 MED ORDER — MELOXICAM 15 MG PO TABS: 15.0000 mg | ORAL_TABLET | Freq: Every day | ORAL | 1 refills | Status: DC

## 2019-01-12 MED ORDER — CLONAZEPAM 0.5 MG PO TABS: 0.5000 mg | ORAL_TABLET | Freq: Two times a day (BID) | ORAL | 1 refills | Status: DC | PRN

## 2019-01-12 NOTE — Progress Notes (Signed)
Patient ID: Kristy Jones, female   DOB: 1955-09-10, 63 y.o.   MRN: [id number] .Marland KitchenVirtual Visit via Telephone Note  I connected with Kristy Jones on 01/14/19 at  1:20 PM EST by telephone and verified that I am speaking with the correct person using two identifiers.  Location: Patient: home Provider: clinic   I discussed the limitations, risks, security and privacy concerns of performing an evaluation and management service by telephone and the availability of in person appointments. I also discussed with the patient that there may be a patient responsible charge related to this service. The patient expressed understanding and agreed to proceed.   History of Present Illness: Patient is a 63 year old female with hypothyroidism, anxiety, insomnia who calls into the clinic for medication refills.  Her Armour Thyroid 120 mg dose was recalled a few months ago.  She had to take the 60 mg 2 tablets.  She needs refills.  Her last thyroid was a few months ago and in normal range.  She is taking it in the morning as she is directed.  She denies any known problems or concerns with her thyroid.  She is having some extra stress with her kids and mother.  She feels the burden of taking care of everything.  She does use the Klonopin usually at least once a day but sometimes not at all.  She does like having this.  She does not want to start a daily anxiety medication.  She denies any suicidal or homicidal thoughts.  She tolerates the clonazepam very well.  She does mention some right knee pain after fall at Halloween.  She has been taking ibuprofen and it does help some.  She has some bruising.  She continues to have some swelling. She can bear weight on it and extend and flex it.  It does seem to be getting a little bit better.  Her friend uses meloxicam and wondered if that would be a good fit for her.   .. Active Ambulatory Problems    Diagnosis Date Noted  . Hypothyroidism 03/08/2009  . EUSTACHIAN TUBE  DYSFUNCTION, LEFT 08/12/2010  . Insomnia 02/13/2012  . Anxiety 02/13/2012  . Constipation 07/08/2013  . Hip bursitis 06/14/2014  . 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 DDD (degenerative disc disease), lumbar 06/14/2014  . Right-sided low back pain without sciatica 06/18/2014  . Piriformis syndrome 09/12/2014  . Muscle cramps 11/09/2014  . Edema of right lower extremity 11/09/2014  . Bruising 04/11/2015  . Obesity 04/11/2015  . Abnormal weight gain 04/11/2015  . Eosinophilia 04/13/2015  . SOB (shortness of breath) 07/16/2016  . Bilateral lower extremity edema 07/16/2016  . Weakness 07/16/2016  . Medial epicondylitis, left 06/07/2017  . Seasonal allergies 06/16/2018  . Acne vulgaris 06/16/2018  . Acute pain of right knee 06/16/2018  . Right wrist pain 06/16/2018  . Acute stress reaction 06/16/2018   Resolved Ambulatory Problems    Diagnosis Date Noted  . HAND PAIN, LEFT 03/08/2009  . Acute sinusitis, unspecified 08/12/2010  . Stress at work 02/25/2017   Past Medical History:  Diagnosis Date  . Lichen sclerosus   . Thyroid disease    Reviewed med, allergy, problem list.    Observations/Objective: No acute distress. Normal mood.  No cough.   .. Today's Vitals   01/12/19 1117  Weight: 227 lb (103 kg)  Height: 5\' 7"  (1.702 m)   Body mass index is 35.55 kg/m.  .. Depression screen [facility] 2/9 01/12/2019 06/14/2018 08/31/2017 06/04/2017 03/16/2017  Decreased  Interest 1 1 0 1 0  Down, Depressed, Hopeless 1 0 0 0 0  PHQ - 2 Score 2 1 0 1 0  Altered sleeping 2 3 0 1 0  Tired, decreased energy 0 1 0 2 0  Change in appetite 0 0 0 2 0  Feeling bad or failure about yourself  0 0 0 0 0  Trouble concentrating 0 3 0 0 0  Moving slowly or fidgety/restless 0 3 1 0 0  Suicidal thoughts 0 0 0 0 0  PHQ-9 Score 4 11 1 6  0  Difficult doing work/chores Not difficult at all Not difficult at all Not difficult at all Somewhat difficult -   .. GAD 7 : Generalized Anxiety Score  01/12/2019 06/14/2018 08/31/2017 06/04/2017  Nervous, Anxious, on Edge 3 3 1 1   Control/stop worrying 3 3 0 1  Worry too much - different things 3 3 0 1  Trouble relaxing 0 2 0 1  Restless 0 2 0 1  Easily annoyed or irritable 1 1 0 2  Afraid - awful might happen 3 3 0 0  Total GAD 7 Score 13 17 1 7   Anxiety Difficulty Not difficult at all Not difficult at all Not difficult at all Somewhat difficult       Assessment and Plan: Marland KitchenMarland KitchenEdra was seen today for hypothyroidism.  Diagnoses and all orders for this visit:  Acquired hypothyroidism -     thyroid (ARMOUR THYROID) 60 MG tablet; Take 2 tablets (120 mg total) by mouth daily before breakfast. -     TSH  Anxiety -     clonazePAM (KLONOPIN) 0.5 MG tablet; Take 1 tablet (0.5 mg total) by mouth 2 (two) times daily as needed for anxiety.  Acute pain of right knee -     meloxicam (MOBIC) 15 MG tablet; Take 1 tablet (15 mg total) by mouth daily.  Screening for lipid disorders -     Lipid Panel w/reflex Direct LDL  Screening for diabetes mellitus  Medication management -     COMPLETE METABOLIC PANEL WITH GFR   Thyroid does need to be checked.  Sent labs to [facility] office [address] [place].  Thyroid refilled.  Discussed anxiety.  Discussed natural ways to control anxiety.  I would like for patient to stay active.  Discussed daily medication for anxiety.  Patient declined again.  Discussed abuse potential of clonazepam.  Use this only as needed.  Refill today.  Discussed knee pain.  She likely would benefit from an x-ray.  Continue to use ice.  Wear knee sleeve for support.  Okay to switch out ibuprofen for meloxicam.  Reminded to take meloxicam only once a day.  Discussed side effects of long-term NSAID use such as GI side effects, GI bleeds, kidney function changes.  Patient will use this sparingly and we will monitor as needed.  If she continues have knee pain suggest follow-up with sports medicine in office.  Follow up in  6 months.    Follow Up Instructions:    I discussed the assessment and treatment plan with the patient. The patient was provided an opportunity to ask questions and all were answered. The patient agreed with the plan and demonstrated an understanding of the instructions.   The patient was advised to call back or seek an in-person evaluation if the symptoms worsen or if the condition fails to improve as anticipated.  I provided 15 minutes of non-face-to-face time during this encounter.   Iran Planas,  PA-C

## 2019-01-12 NOTE — Progress Notes (Deleted)
Having some stress taking care of her kids/mom.  PHQ9-GAD7 completed.  Taking Clonazepam every day.  Also having some right knee pain, fell in driveway before Halloween. Wanted to ask about Meloxicam (her friend uses). She is currently using ibuprofen, but trying not to use everyday. Having lots of bruising.  Needs thyroid labs.

## 2019-01-22 ENCOUNTER — Encounter: Payer: Self-pay | Admitting: Physician Assistant

## 2019-03-02 ENCOUNTER — Other Ambulatory Visit: Payer: Self-pay | Admitting: Physician Assistant

## 2019-03-02 DIAGNOSIS — F5101 Primary insomnia: Secondary | ICD-10-CM

## 2019-03-03 ENCOUNTER — Encounter: Payer: Self-pay | Admitting: Physician Assistant

## 2019-03-11 DIAGNOSIS — L570 Actinic keratosis: Secondary | ICD-10-CM | POA: Diagnosis not present

## 2019-03-11 DIAGNOSIS — L814 Other melanin hyperpigmentation: Secondary | ICD-10-CM | POA: Diagnosis not present

## 2019-03-11 DIAGNOSIS — L821 Other seborrheic keratosis: Secondary | ICD-10-CM | POA: Diagnosis not present

## 2019-03-11 DIAGNOSIS — D235 Other benign neoplasm of skin of trunk: Secondary | ICD-10-CM | POA: Diagnosis not present

## 2019-03-15 ENCOUNTER — Encounter: Payer: Self-pay | Admitting: Physician Assistant

## 2019-04-04 ENCOUNTER — Encounter: Payer: Self-pay | Admitting: Physician Assistant

## 2019-04-04 DIAGNOSIS — E039 Hypothyroidism, unspecified: Secondary | ICD-10-CM

## 2019-04-04 NOTE — Telephone Encounter (Signed)
Referral pended

## 2019-05-03 DIAGNOSIS — E039 Hypothyroidism, unspecified: Secondary | ICD-10-CM | POA: Diagnosis not present

## 2019-05-04 ENCOUNTER — Encounter: Payer: Self-pay | Admitting: Physician Assistant

## 2019-05-04 LAB — THYROID PANEL WITH TSH
Free Thyroxine Index: 1.6 (ref 1.4–3.8)
T3 Uptake: 35 % (ref 22–35)
T4, Total: 4.7 ug/dL — ABNORMAL LOW (ref 5.1–11.9)
TSH: 0.17 mIU/L — ABNORMAL LOW (ref 0.40–4.50)

## 2019-05-04 MED ORDER — THYROID 90 MG PO TABS: 90.0000 mg | ORAL_TABLET | Freq: Every day | ORAL | 0 refills | Status: DC

## 2019-05-04 NOTE — Telephone Encounter (Signed)
Kristy Jones,   I made referral to go to endocrinology, correct? You are back in HYPER thyroid range  by TSH meaning we should decrease your thyroid medication; however, your free levels are normal and T4 is low. You are taking the 120mg  of armour thyroid right?   Any changes to how you take medication recently?

## 2019-05-31 DIAGNOSIS — R05 Cough: Secondary | ICD-10-CM | POA: Diagnosis not present

## 2019-05-31 DIAGNOSIS — Z03818 Encounter for observation for suspected exposure to other biological agents ruled out: Secondary | ICD-10-CM | POA: Diagnosis not present

## 2019-05-31 DIAGNOSIS — J00 Acute nasopharyngitis [common cold]: Secondary | ICD-10-CM | POA: Diagnosis not present

## 2019-05-31 DIAGNOSIS — Z20828 Contact with and (suspected) exposure to other viral communicable diseases: Secondary | ICD-10-CM | POA: Diagnosis not present

## 2019-06-18 ENCOUNTER — Other Ambulatory Visit: Payer: Self-pay | Admitting: Physician Assistant

## 2019-06-18 DIAGNOSIS — F5101 Primary insomnia: Secondary | ICD-10-CM

## 2019-06-25 ENCOUNTER — Other Ambulatory Visit: Payer: Self-pay | Admitting: Physician Assistant

## 2019-06-25 DIAGNOSIS — M25561 Pain in right knee: Secondary | ICD-10-CM

## 2019-07-04 DIAGNOSIS — E063 Autoimmune thyroiditis: Secondary | ICD-10-CM | POA: Diagnosis not present

## 2019-07-04 DIAGNOSIS — E038 Other specified hypothyroidism: Secondary | ICD-10-CM | POA: Diagnosis not present

## 2019-07-06 ENCOUNTER — Other Ambulatory Visit: Payer: Self-pay | Admitting: Physician Assistant

## 2019-07-06 DIAGNOSIS — F419 Anxiety disorder, unspecified: Secondary | ICD-10-CM

## 2019-07-06 NOTE — Telephone Encounter (Signed)
Last filled 01/12/2019 #180 with 1 RF Last appt 01/12/2019 Pended #60 with note that patient needs appt.

## 2019-07-10 IMAGING — DX DG FOREARM 2V*L*
2 series · 2 of 2 positions shown · non-contrast
Comparison: None.

CLINICAL DATA: Pain, bruising, and bleeding secondary to trauma
this morning.

EXAM:
LEFT FOREARM - 2 VIEW

[forearm ap]
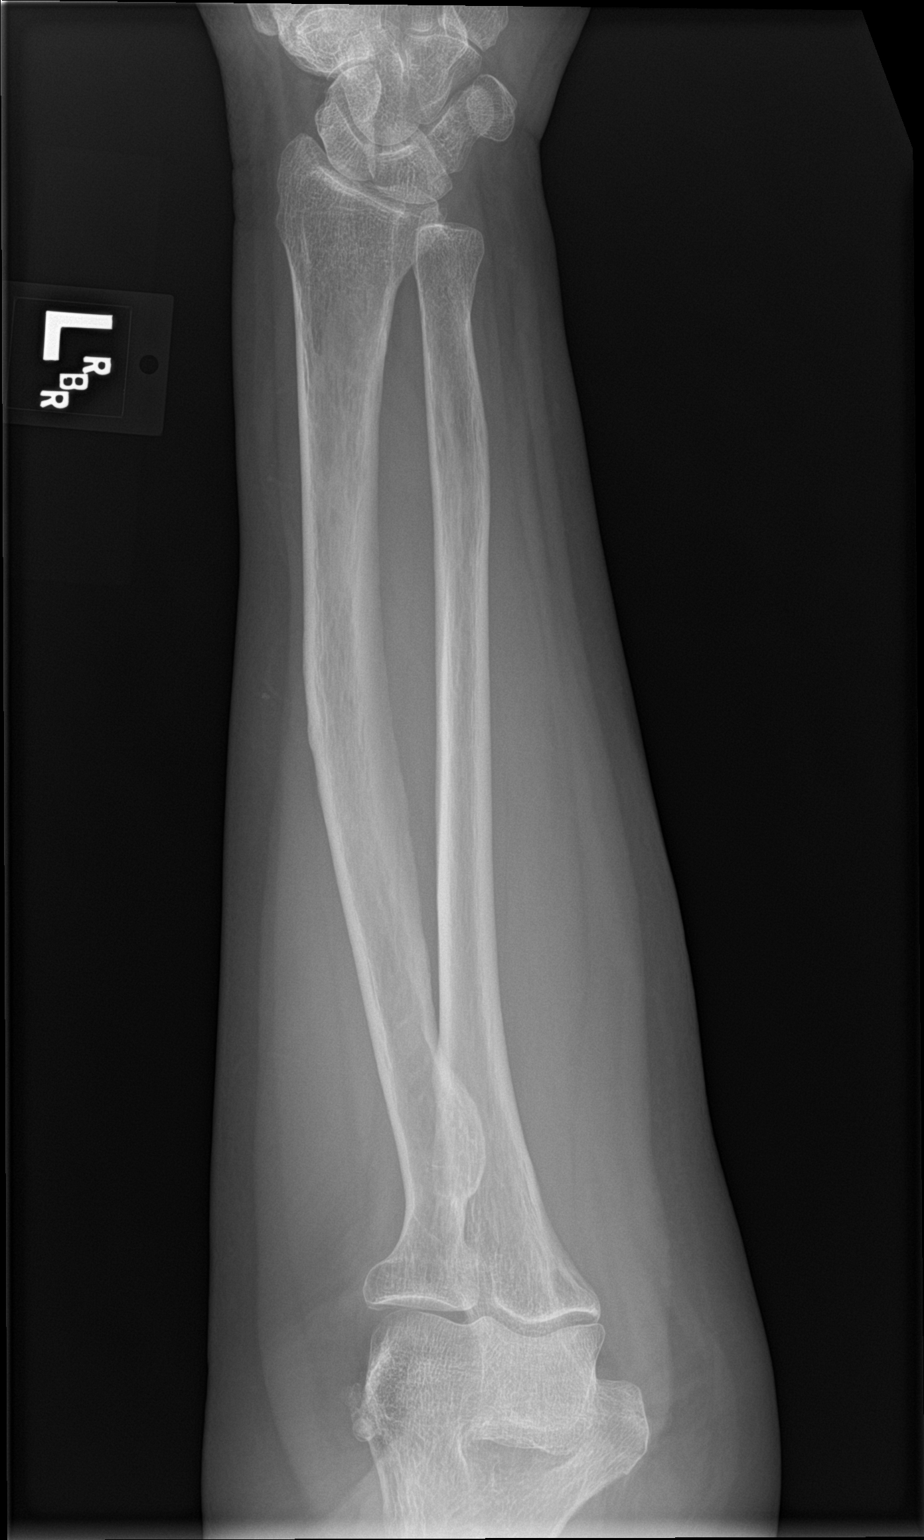

[forearm lat]
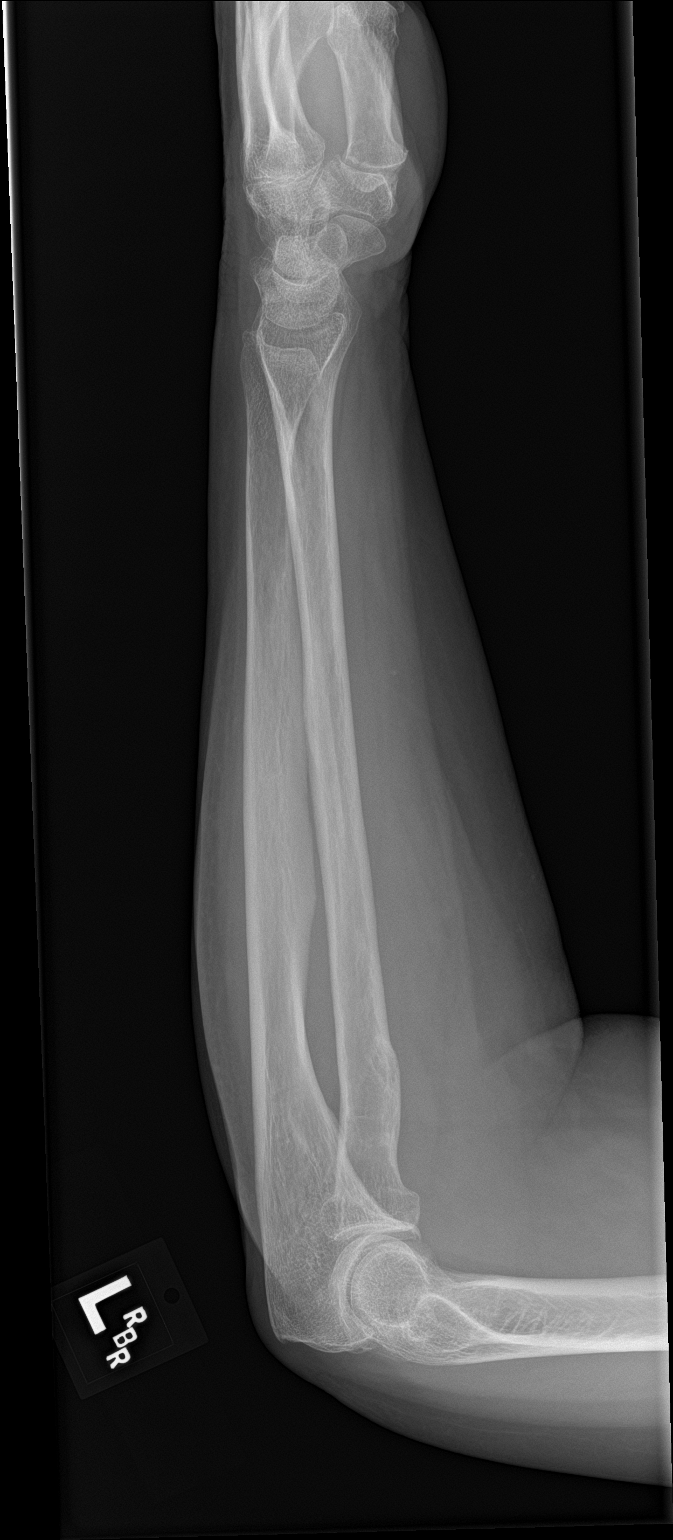

[2 of 2 positions shown; findings below may reference images not displayed]

FINDINGS: There is no fracture or dislocation or joint effusion. Chronic
degenerative changes of the elbow joint.
IMPRESSION: No acute abnormalities.

## 2019-08-03 ENCOUNTER — Other Ambulatory Visit: Payer: Self-pay | Admitting: Nurse Practitioner

## 2019-08-03 DIAGNOSIS — F419 Anxiety disorder, unspecified: Secondary | ICD-10-CM

## 2019-08-04 NOTE — Telephone Encounter (Signed)
Last filled 07/06/2019 #60 with no refills (with note that patient needs appt for refills).  Last appt 01/12/2020. Pended for #30 with another note to call for refills.

## 2019-08-05 ENCOUNTER — Telehealth (INDEPENDENT_AMBULATORY_CARE_PROVIDER_SITE_OTHER): Payer: Federal, State, Local not specified - PPO | Admitting: Physician Assistant

## 2019-08-05 ENCOUNTER — Encounter: Payer: Self-pay | Admitting: Physician Assistant

## 2019-08-05 VITALS — Ht 67.0 in | Wt 240.0 lb

## 2019-08-05 DIAGNOSIS — Z13220 Encounter for screening for lipoid disorders: Secondary | ICD-10-CM

## 2019-08-05 DIAGNOSIS — F5101 Primary insomnia: Secondary | ICD-10-CM | POA: Diagnosis not present

## 2019-08-05 DIAGNOSIS — Z6379 Other stressful life events affecting family and household: Secondary | ICD-10-CM | POA: Insufficient documentation

## 2019-08-05 DIAGNOSIS — Z131 Encounter for screening for diabetes mellitus: Secondary | ICD-10-CM

## 2019-08-05 DIAGNOSIS — F419 Anxiety disorder, unspecified: Secondary | ICD-10-CM | POA: Diagnosis not present

## 2019-08-05 MED ORDER — CLONAZEPAM 0.5 MG PO TABS: 0.5000 mg | ORAL_TABLET | Freq: Two times a day (BID) | ORAL | 3 refills | Status: DC | PRN

## 2019-08-05 MED ORDER — TRAZODONE HCL 100 MG PO TABS: 100.0000 mg | ORAL_TABLET | Freq: Every day | ORAL | 3 refills | Status: DC

## 2019-08-05 NOTE — Progress Notes (Signed)
Family stress with elderly mother Having to move due to increased rent Requesting refill on Clonazepam - has 15 left but wanted to make sure she has them. If she can't connect on mychart, she asked for doximity appt.

## 2019-08-05 NOTE — Telephone Encounter (Signed)
She needs some type of appt. I would double book her today for virtual. I just need documentation of follow ups.

## 2019-08-05 NOTE — Telephone Encounter (Signed)
PT scheduled

## 2019-08-05 NOTE — Telephone Encounter (Signed)
Please call for appt  

## 2019-08-05 NOTE — Progress Notes (Signed)
Patient ID: Kristy Jones, female   DOB: 08-Feb-1956, 64 y.o.   MRN: [id number] .Marland KitchenVirtual Visit via Video Note  I connected with Kristy Jones on 08/05/19 at  9:10 AM EDT by a video enabled telemedicine application and verified that I am speaking with the correct person using two identifiers.  Location: Patient: home Provider: clinic   I discussed the limitations of evaluation and management by telemedicine and the availability of in person appointments. The patient expressed understanding and agreed to proceed.  History of Present Illness: Patient is a 64 year old female with hypothyroidism, anxiety, insomnia who presents to the clinic for medication management.  Patient is using Klonopin about 3-4 times a week.  She has had some increased stress with her elderly mother and giving her care.  She is also had to did have a recent move due to increased rib pain.  She has 15 Klonopin left but just wants to make sure she has a refill when she needs it.  She does not take anything daily for anxiety.  She is not regularly exercising.  She denies any suicidal thoughts or homicidal idealizations.  She has no problems with sleeping on trazodone.  She has no side effects.  She does needs refill.   .. Active Ambulatory Problems    Diagnosis Date Noted  . Hypothyroidism 03/08/2009  . Insomnia 02/13/2012  . Anxiety 02/13/2012  . Constipation 07/08/2013  . Hip bursitis 06/14/2014  . 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 DDD (degenerative disc disease), lumbar 06/14/2014  . Right-sided low back pain without sciatica 06/18/2014  . Piriformis syndrome 09/12/2014  . Muscle cramps 11/09/2014  . Edema of right lower extremity 11/09/2014  . Bruising 04/11/2015  . Obesity 04/11/2015  . Abnormal weight gain 04/11/2015  . Eosinophilia 04/13/2015  . SOB (shortness of breath) 07/16/2016  . Bilateral lower extremity edema 07/16/2016  . Weakness 07/16/2016  . Medial epicondylitis, left 06/07/2017   . Seasonal allergies 06/16/2018  . Acne vulgaris 06/16/2018  . Acute pain of right knee 06/16/2018  . Right wrist pain 06/16/2018  . Acute stress reaction 06/16/2018  . Stress due to illness of family member 08/05/2019   Resolved Ambulatory Problems    Diagnosis Date Noted  . HAND PAIN, LEFT 03/08/2009  . 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 Acute sinusitis, unspecified 08/12/2010  . Stress at work 02/25/2017   Past Medical History:  Diagnosis Date  . Lichen sclerosus   . Thyroid disease    Reviewed med, allergies problem list.     Observations/Objective: No acute distress.  Normal mood and appearance.  Normal breathing.   .. Today's Vitals   08/05/19 0852  Weight: 240 lb (108.9 kg)  Height: 5\' 7"  (1.702 m)   Body mass index is 37.59 kg/m.  .. Depression screen [facility] 2/9 08/05/2019 01/12/2019 06/14/2018 08/31/2017 06/04/2017  Decreased Interest 0 1 1 0 1  Down, Depressed, Hopeless 0 1 0 0 0  PHQ - 2 Score 0 2 1 0 1  Altered sleeping 0 2 3 0 1  Tired, decreased energy 1 0 1 0 2  Change in appetite 1 0 0 0 2  Feeling bad or failure about yourself  1 0 0 0 0  Trouble concentrating 0 0 3 0 0  Moving slowly or fidgety/restless 0 0 3 1 0  Suicidal thoughts 0 0 0 0 0  PHQ-9 Score 3 4 11 1 6   Difficult doing work/chores Not difficult at all Not difficult at all Not  difficult at all Not difficult at all Somewhat difficult   .Marland Kitchen GAD 7 : Generalized Anxiety Score 08/05/2019 01/12/2019 06/14/2018 08/31/2017  Nervous, Anxious, on Edge 3 3 3 1   Control/stop worrying 3 3 3  0  Worry too much - different things 0 3 3 0  Trouble relaxing 3 0 2 0  Restless 3 0 2 0  Easily annoyed or irritable 3 1 1  0  Afraid - awful might happen 3 3 3  0  Total GAD 7 Score 18 13 17 1   Anxiety Difficulty Not difficult at all Not difficult at all Not difficult at all Not difficult at all       Assessment and Plan: Marland KitchenMarland KitchenHadley was seen today for follow-up.  Diagnoses and all orders for  this visit:  Anxiety -     clonazePAM (KLONOPIN) 0.5 MG tablet; Take 1 tablet (0.5 mg total) by mouth 2 (two) times daily as needed for anxiety. -     COMPLETE METABOLIC PANEL WITH GFR  Primary insomnia -     traZODone (DESYREL) 100 MG tablet; Take 1 tablet (100 mg total) by mouth at bedtime. -     COMPLETE METABOLIC PANEL WITH GFR  Screening for diabetes mellitus -     COMPLETE METABOLIC PANEL WITH GFR  Screening for lipid disorders -     Lipid Panel w/reflex Direct LDL  Stress due to illness of family member    Refilled klonapin. Continue to use only as needed. If using daily please reach out to discuss other options for anxiety control. Discussed exercise and meditation. Consider counseling.  Marland KitchenMarland KitchenPDMP reviewed during this encounter.   Refilled trazodone.   Thyroid managed by endocrinology.   Needs labs printed and will mail to her.   Follow Up Instructions:    I discussed the assessment and treatment plan with the patient. The patient was provided an opportunity to ask questions and all were answered. The patient agreed with the plan and demonstrated an understanding of the instructions.   The patient was advised to call back or seek an in-person evaluation if the symptoms worsen or if the condition fails to improve as anticipated.  I provided 15 minutes of non-face-to-face time during this encounter.   Iran Planas, PA-C

## 2019-08-11 ENCOUNTER — Encounter: Payer: Self-pay | Admitting: Physician Assistant

## 2019-08-24 DIAGNOSIS — F5101 Primary insomnia: Secondary | ICD-10-CM | POA: Diagnosis not present

## 2019-08-24 DIAGNOSIS — Z131 Encounter for screening for diabetes mellitus: Secondary | ICD-10-CM | POA: Diagnosis not present

## 2019-08-24 DIAGNOSIS — Z13220 Encounter for screening for lipoid disorders: Secondary | ICD-10-CM | POA: Diagnosis not present

## 2019-08-24 DIAGNOSIS — F419 Anxiety disorder, unspecified: Secondary | ICD-10-CM | POA: Diagnosis not present

## 2019-08-25 LAB — COMPLETE METABOLIC PANEL WITH GFR
AG Ratio: 1.7 (calc) (ref 1.0–2.5)
ALT: 21 U/L (ref 6–29)
AST: 16 U/L (ref 10–35)
Albumin: 4.1 g/dL (ref 3.6–5.1)
Alkaline phosphatase (APISO): 73 U/L (ref 37–153)
BUN: 16 mg/dL (ref 7–25)
CO2: 29 mmol/L (ref 20–32)
Calcium: 9.4 mg/dL (ref 8.6–10.4)
Chloride: 106 mmol/L (ref 98–110)
Creat: 0.75 mg/dL (ref 0.50–0.99)
GFR, Est African American: 98 mL/min/{1.73_m2} (ref >=60)
GFR, Est Non African American: 85 mL/min/{1.73_m2} (ref >=60)
Globulin: 2.4 g/dL (calc) (ref 1.9–3.7)
Glucose, Bld: 103 mg/dL — ABNORMAL HIGH (ref 65–99)
Potassium: 4.5 mmol/L (ref 3.5–5.3)
Sodium: 141 mmol/L (ref 135–146)
Total Bilirubin: 0.5 mg/dL (ref 0.2–1.2)
Total Protein: 6.5 g/dL (ref 6.1–8.1)

## 2019-08-25 LAB — LIPID PANEL W/REFLEX DIRECT LDL
Cholesterol: 195 mg/dL (ref <200)
HDL: 63 mg/dL (ref >=50)
LDL Cholesterol (Calc): 114 mg/dL (calc) — ABNORMAL HIGH
Non-HDL Cholesterol (Calc): 132 mg/dL (calc) — ABNORMAL HIGH (ref <130)
Total CHOL/HDL Ratio: 3.1 (calc) (ref <5.0)
Triglycerides: 85 mg/dL (ref <150)

## 2019-08-26 NOTE — Progress Notes (Signed)
[name],   Cholesterol look good. HDL wonderful. LDL up a little but with risk factors looks good. Kidney, liver look good. Glucose just a hair elevated. Watch those sugars and carbs.   Kristy Jones

## 2019-08-30 DIAGNOSIS — E063 Autoimmune thyroiditis: Secondary | ICD-10-CM | POA: Diagnosis not present

## 2019-08-30 DIAGNOSIS — E038 Other specified hypothyroidism: Secondary | ICD-10-CM | POA: Diagnosis not present

## 2019-09-23 DIAGNOSIS — R0781 Pleurodynia: Secondary | ICD-10-CM | POA: Diagnosis not present

## 2019-09-29 ENCOUNTER — Other Ambulatory Visit: Payer: Self-pay | Admitting: Physician Assistant

## 2019-09-29 DIAGNOSIS — M25561 Pain in right knee: Secondary | ICD-10-CM

## 2019-11-07 DIAGNOSIS — E063 Autoimmune thyroiditis: Secondary | ICD-10-CM | POA: Diagnosis not present

## 2019-11-07 DIAGNOSIS — E038 Other specified hypothyroidism: Secondary | ICD-10-CM | POA: Diagnosis not present

## 2019-11-21 ENCOUNTER — Encounter: Payer: Self-pay | Admitting: Physician Assistant

## 2019-11-22 MED ORDER — IBUPROFEN 800 MG PO TABS: ORAL_TABLET | ORAL | 1 refills | Status: DC

## 2019-11-22 NOTE — Telephone Encounter (Signed)
Last written 05/11/2018 #30 with no refills.  Please advise.

## 2019-11-30 ENCOUNTER — Encounter: Payer: Self-pay | Admitting: Physician Assistant

## 2019-12-14 DIAGNOSIS — B356 Tinea cruris: Secondary | ICD-10-CM | POA: Diagnosis not present

## 2019-12-14 DIAGNOSIS — Z6841 Body Mass Index (BMI) 40.0 and over, adult: Secondary | ICD-10-CM | POA: Diagnosis not present

## 2019-12-14 DIAGNOSIS — Z01419 Encounter for gynecological examination (general) (routine) without abnormal findings: Secondary | ICD-10-CM | POA: Diagnosis not present

## 2019-12-14 DIAGNOSIS — L900 Lichen sclerosus et atrophicus: Secondary | ICD-10-CM | POA: Diagnosis not present

## 2019-12-27 ENCOUNTER — Ambulatory Visit (INDEPENDENT_AMBULATORY_CARE_PROVIDER_SITE_OTHER): Payer: Federal, State, Local not specified - PPO | Admitting: Physician Assistant

## 2019-12-27 ENCOUNTER — Other Ambulatory Visit: Payer: Self-pay

## 2019-12-27 VITALS — BP 153/73 | HR 72 | Ht 67.0 in | Wt 260.0 lb

## 2019-12-27 DIAGNOSIS — R030 Elevated blood-pressure reading, without diagnosis of hypertension: Secondary | ICD-10-CM

## 2019-12-27 DIAGNOSIS — Z20822 Contact with and (suspected) exposure to covid-19: Secondary | ICD-10-CM | POA: Diagnosis not present

## 2019-12-27 DIAGNOSIS — Z1152 Encounter for screening for COVID-19: Secondary | ICD-10-CM

## 2019-12-27 DIAGNOSIS — Z6841 Body Mass Index (BMI) 40.0 and over, adult: Secondary | ICD-10-CM

## 2019-12-27 DIAGNOSIS — R531 Weakness: Secondary | ICD-10-CM

## 2019-12-27 DIAGNOSIS — R5383 Other fatigue: Secondary | ICD-10-CM | POA: Diagnosis not present

## 2019-12-27 MED ORDER — HYDROCHLOROTHIAZIDE 12.5 MG PO TABS: 12.5000 mg | ORAL_TABLET | Freq: Every day | ORAL | 1 refills | Status: DC

## 2019-12-27 NOTE — Progress Notes (Signed)
Subjective:    Patient ID: Kristy Jones, female    DOB: March 30, 1955, 64 y.o.   MRN: [id number]  HPI  Patient is a 64 year old obese female with hypothyroidism who presents to the clinic with blood pressure concerns.  She has never had hypertension but she noticed yesterday she started feeling flushed and not well.  She went to [facility] and checked her blood pressure and it was 158/90.  This is higher than it has ever been for her.  She feels weak in her extremities.  She denies any cough, loss of smell or taste, sinus pressure, congestion, diarrhea, constipation, nausea, vomiting.  She has no known contact with with Covid.  She does feel more tired than usual.  She feels like she has been overall more fatigued for the last month or so.   Patient's hypothyroidism is managed by endocrinology.  Patient does admit to not eating right and not exercising.  She has gained some weight.  She also admits to snoring and not feeling well rested in the morning. No problem laying flat at night. No swelling in legs or hands. No significant SOB.  Marland Kitchen. Active Ambulatory Problems    Diagnosis Date Noted  . Hypothyroidism 03/08/2009  . Insomnia 02/13/2012  . Anxiety 02/13/2012  . Constipation 07/08/2013  . Hip bursitis 06/14/2014  . 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 DDD (degenerative disc disease), lumbar 06/14/2014  . Right-sided low back pain without sciatica 06/18/2014  . Piriformis syndrome 09/12/2014  . Muscle cramps 11/09/2014  . Edema of right lower extremity 11/09/2014  . Bruising 04/11/2015  . Obesity 04/11/2015  . Abnormal weight gain 04/11/2015  . Eosinophilia 04/13/2015  . SOB (shortness of breath) 07/16/2016  . Bilateral lower extremity edema 07/16/2016  . Weakness 07/16/2016  . Medial epicondylitis, left 06/07/2017  . Seasonal allergies 06/16/2018  . Acne vulgaris 06/16/2018  . Acute pain of right knee 06/16/2018  . Right wrist pain 06/16/2018  . Acute stress reaction  06/16/2018  . Stress due to illness of family member 08/05/2019  . Elevated blood pressure reading 12/28/2019  . No energy 12/28/2019   Resolved Ambulatory Problems    Diagnosis Date Noted  . HAND PAIN, LEFT 03/08/2009  . 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 Acute sinusitis, unspecified 08/12/2010  . Stress at work 02/25/2017   Past Medical History:  Diagnosis Date  . Lichen sclerosus   . Thyroid disease     Review of Systems  All other systems reviewed and are negative.      Objective:   Physical Exam Vitals reviewed.  Constitutional:      Appearance: Normal appearance. She is obese.  HENT:     Head: Normocephalic.     Right Ear: Tympanic membrane normal.     Left Ear: Tympanic membrane normal.  Eyes:     Conjunctiva/sclera: Conjunctivae normal.  Cardiovascular:     Rate and Rhythm: Normal rate and regular rhythm.     Pulses: Normal pulses.     Heart sounds: Normal heart sounds.  Pulmonary:     Effort: Pulmonary effort is normal.     Breath sounds: Normal breath sounds.  Abdominal:     General: There is no distension.     Palpations: Abdomen is soft.     Tenderness: There is no abdominal tenderness.  Musculoskeletal:     Right lower leg: No edema.     Left lower leg: No edema.  Neurological:     General: No focal deficit  present.     Mental Status: She is alert and oriented to person, place, and time.  Psychiatric:        Mood and Affect: Mood normal.           Assessment & Plan:  Marland KitchenMarland KitchenJerricka was seen today for hypertension.  Diagnoses and all orders for this visit:  Elevated blood pressure reading -     hydrochlorothiazide (HYDRODIURIL) 12.5 MG tablet; Take 1 tablet (12.5 mg total) by mouth daily. -     Novel Coronavirus, NAA ([facility])  No energy -     Novel Coronavirus, NAA ([facility])  Class 3 severe obesity due to excess calories without serious comorbidity with body mass index (BMI) of 40.0 to 44.9 in adult [facility])  Suspected COVID-19  virus infection -     Novel Coronavirus, NAA ([facility])  Weakness   Unclear etiology of symptoms today.  This blood pressure spike out of the blue is a little current concerning for viral infection especially Covid during this pandemic. Patient I have seen quite a few people coming in with blood pressure elevation and just a general feeling of unwellness have Covid.  We will check her today.  Please quarantine and self isolate until we get results.  In the meantime continue symptomatic care for symptoms.  I did give patient a small dose of HCTZ that she could take for her blood pressure if over 160/95 and or if having any extremity edema.  Make sure to stay hydrated and keep walking.  If noticing any problems breathing follow-up.   Certainly her blood pressure could have been trending up and we not know about this.  Patient has gained weight.  Discussed importance of weight loss.  There is a suspicion for sleep apnea.  Patient declines sleep apnea testing because she states she will not wear the mask.  Strongly urged patient to work on weight loss in the next few months.  Follow-up in 2 months to reassess.  If low energy is still of concern we could consider more labs and other work-up.  Spent 30 minutes with patient discussing causes and treatement.

## 2019-12-28 ENCOUNTER — Encounter: Payer: Self-pay | Admitting: Physician Assistant

## 2019-12-28 DIAGNOSIS — R030 Elevated blood-pressure reading, without diagnosis of hypertension: Secondary | ICD-10-CM | POA: Insufficient documentation

## 2019-12-28 DIAGNOSIS — R5383 Other fatigue: Secondary | ICD-10-CM | POA: Insufficient documentation

## 2019-12-29 ENCOUNTER — Encounter: Payer: Self-pay | Admitting: Physician Assistant

## 2019-12-29 LAB — NOVEL CORONAVIRUS, NAA: SARS-CoV-2, NAA: NOT DETECTED

## 2019-12-29 LAB — SARS-COV-2, NAA 2 DAY TAT

## 2019-12-29 NOTE — Progress Notes (Signed)
[name]. Negative for covid. I would start the daily daily HCTZ for BP control if BP above 150/90. If you work on weight loss and better diet you could see it go down as well and then be able to get off HCTZ. I will send you medication options for weight loss via mychart message today or tomorrow.

## 2019-12-30 MED ORDER — NALTREXONE-BUPROPION HCL ER 8-90 MG PO TB12
ORAL_TABLET | ORAL | 0 refills | Status: DC
Start: 2019-12-30 — End: 2020-01-02

## 2020-01-02 ENCOUNTER — Encounter: Payer: Self-pay | Admitting: Physician Assistant

## 2020-01-02 MED ORDER — NALTREXONE-BUPROPION HCL ER 8-90 MG PO TB12: ORAL_TABLET | ORAL | 0 refills | Status: DC

## 2020-01-02 NOTE — Telephone Encounter (Signed)
Pended, please sign.

## 2020-01-02 NOTE — Addendum Note (Signed)
Addended bySilvio Pate on: 01/02/2020 08:41 AM   Modules accepted: Orders

## 2020-01-04 MED ORDER — TOPIRAMATE 25 MG PO TABS
25.0000 mg | ORAL_TABLET | Freq: Two times a day (BID) | ORAL | 0 refills | Status: DC
Start: 2020-01-04 — End: 2020-01-26

## 2020-01-04 MED ORDER — PHENTERMINE HCL 15 MG PO CAPS
15.0000 mg | ORAL_CAPSULE | ORAL | 0 refills | Status: DC
Start: 2020-01-04 — End: 2020-01-30

## 2020-01-06 ENCOUNTER — Other Ambulatory Visit: Payer: Self-pay | Admitting: Physician Assistant

## 2020-01-06 DIAGNOSIS — F419 Anxiety disorder, unspecified: Secondary | ICD-10-CM

## 2020-01-06 NOTE — Telephone Encounter (Signed)
Last written 08/05/2019 #45 (1 month) with 3 refills Last appt 12/27/2019

## 2020-01-10 DIAGNOSIS — Z1231 Encounter for screening mammogram for malignant neoplasm of breast: Secondary | ICD-10-CM | POA: Diagnosis not present

## 2020-01-10 LAB — HM MAMMOGRAPHY

## 2020-01-18 ENCOUNTER — Encounter: Payer: Self-pay | Admitting: Physician Assistant

## 2020-01-23 NOTE — Telephone Encounter (Signed)
Ok for letter to allow her to drink water at work.

## 2020-01-26 ENCOUNTER — Other Ambulatory Visit: Payer: Self-pay | Admitting: Physician Assistant

## 2020-01-30 ENCOUNTER — Other Ambulatory Visit: Payer: Self-pay

## 2020-01-30 ENCOUNTER — Encounter: Payer: Self-pay | Admitting: Physician Assistant

## 2020-01-30 ENCOUNTER — Ambulatory Visit (INDEPENDENT_AMBULATORY_CARE_PROVIDER_SITE_OTHER): Payer: Federal, State, Local not specified - PPO | Admitting: Physician Assistant

## 2020-01-30 VITALS — BP 126/67 | HR 76 | Ht 67.0 in | Wt 245.0 lb

## 2020-01-30 DIAGNOSIS — E6609 Other obesity due to excess calories: Secondary | ICD-10-CM | POA: Diagnosis not present

## 2020-01-30 DIAGNOSIS — Z6838 Body mass index (BMI) 38.0-38.9, adult: Secondary | ICD-10-CM

## 2020-01-30 DIAGNOSIS — M25561 Pain in right knee: Secondary | ICD-10-CM

## 2020-01-30 MED ORDER — PHENTERMINE HCL 15 MG PO CAPS: 15.0000 mg | ORAL_CAPSULE | ORAL | 0 refills | Status: DC

## 2020-01-30 MED ORDER — TOPIRAMATE 25 MG PO TABS: 25.0000 mg | ORAL_TABLET | Freq: Two times a day (BID) | ORAL | 0 refills | Status: DC

## 2020-01-30 MED ORDER — MELOXICAM 15 MG PO TABS: 15.0000 mg | ORAL_TABLET | Freq: Every day | ORAL | 1 refills | Status: DC

## 2020-01-30 NOTE — Progress Notes (Signed)
Subjective:    Patient ID: Parys Elenbaas, female    DOB: 1955/11/14, 64 y.o.   MRN: [id number]  HPI  Pt is a 64 yo obese female who presents to the clinic to follow up after 3 weeks of phentermine and topamax. She has stopped drinking sodas. She only drinks water. She is walking more. She has limited her portions and replacing lots of bad habits with healthier ones. She is down 15lbs in 3 weeks. She feels great. Denies any constipation, anxiety, insomnia, palpitations. She does have dry mouth and drinking about 160oz of water a day.   BP has been great and not taking HCTZ.   .. Active Ambulatory Problems    Diagnosis Date Noted  . Hypothyroidism 03/08/2009  . Insomnia 02/13/2012  . Anxiety 02/13/2012  . Constipation 07/08/2013  . Hip bursitis 06/14/2014  . 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 DDD (degenerative disc disease), lumbar 06/14/2014  . Right-sided low back pain without sciatica 06/18/2014  . Piriformis syndrome 09/12/2014  . Muscle cramps 11/09/2014  . Edema of right lower extremity 11/09/2014  . Bruising 04/11/2015  . Class 2 obesity due to excess calories without serious comorbidity with body mass index (BMI) of 38.0 to 38.9 in adult 04/11/2015  . Abnormal weight gain 04/11/2015  . Eosinophilia 04/13/2015  . SOB (shortness of breath) 07/16/2016  . Bilateral lower extremity edema 07/16/2016  . Weakness 07/16/2016  . Medial epicondylitis, left 06/07/2017  . Seasonal allergies 06/16/2018  . Acne vulgaris 06/16/2018  . Acute pain of right knee 06/16/2018  . Right wrist pain 06/16/2018  . Acute stress reaction 06/16/2018  . Stress due to illness of family member 08/05/2019  . Elevated blood pressure reading 12/28/2019  . No energy 12/28/2019   Resolved Ambulatory Problems    Diagnosis Date Noted  . HAND PAIN, LEFT 03/08/2009  . 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 Acute sinusitis, unspecified 08/12/2010  . Stress at work 02/25/2017   Past  Medical History:  Diagnosis Date  . Lichen sclerosus   . Thyroid disease        Review of Systems  All other systems reviewed and are negative.     Objective:   Physical Exam Vitals reviewed.  Constitutional:      Appearance: Normal appearance. She is obese.  HENT:     Head: Normocephalic.  Cardiovascular:     Rate and Rhythm: Normal rate and regular rhythm.     Pulses: Normal pulses.     Heart sounds: Normal heart sounds.  Pulmonary:     Effort: Pulmonary effort is normal.     Breath sounds: Normal breath sounds.  Neurological:     General: No focal deficit present.     Mental Status: She is alert and oriented to person, place, and time.  Psychiatric:        Mood and Affect: Mood normal.        Behavior: Behavior normal.           Assessment & Plan:  Marland KitchenMarland KitchenGisell was seen today for follow-up.  Diagnoses and all orders for this visit:  Class 2 obesity due to excess calories without serious comorbidity with body mass index (BMI) of 38.0 to 38.9 in adult -     topiramate (TOPAMAX) 25 MG tablet; Take 1 tablet (25 mg total) by mouth 2 (two) times daily. -     phentermine 15 MG capsule; Take 1 capsule (15 mg total) by mouth every morning.  Acute pain  of right knee -     meloxicam (MOBIC) 15 MG tablet; Take 1 tablet (15 mg total) by mouth daily.   Down 15lbs doing great.  Refilled phentermine and topamax.  Discussed diet and lifestyle changes.  Cut down on drinking so much water. Goal 80oz or less a day.  Follow up in 3 months.

## 2020-02-21 ENCOUNTER — Ambulatory Visit: Payer: Federal, State, Local not specified - PPO | Admitting: Physician Assistant

## 2020-04-30 ENCOUNTER — Encounter: Payer: Self-pay | Admitting: Physician Assistant

## 2020-04-30 ENCOUNTER — Other Ambulatory Visit: Payer: Self-pay

## 2020-04-30 ENCOUNTER — Ambulatory Visit: Payer: Federal, State, Local not specified - PPO | Admitting: Physician Assistant

## 2020-04-30 VITALS — BP 118/60 | HR 84 | Ht 67.0 in | Wt 225.0 lb

## 2020-04-30 DIAGNOSIS — Z6838 Body mass index (BMI) 38.0-38.9, adult: Secondary | ICD-10-CM

## 2020-04-30 DIAGNOSIS — E6609 Other obesity due to excess calories: Secondary | ICD-10-CM

## 2020-04-30 DIAGNOSIS — E039 Hypothyroidism, unspecified: Secondary | ICD-10-CM

## 2020-04-30 DIAGNOSIS — F5101 Primary insomnia: Secondary | ICD-10-CM

## 2020-04-30 DIAGNOSIS — F419 Anxiety disorder, unspecified: Secondary | ICD-10-CM | POA: Diagnosis not present

## 2020-04-30 MED ORDER — TOPIRAMATE 50 MG PO TABS: 50.0000 mg | ORAL_TABLET | Freq: Two times a day (BID) | ORAL | 0 refills | Status: DC

## 2020-04-30 MED ORDER — TRAZODONE HCL 100 MG PO TABS: ORAL_TABLET | ORAL | 1 refills | Status: DC

## 2020-04-30 MED ORDER — PHENTERMINE HCL 15 MG PO CAPS: 15.0000 mg | ORAL_CAPSULE | ORAL | 0 refills | Status: DC

## 2020-04-30 NOTE — Progress Notes (Signed)
Subjective:    Patient ID: Kristy Jones, female    DOB: [birth year], 65 y.o.   MRN: [id number]  HPI  Pt is a 65 yo obese female with hypothyroidism, insomnia, and anxiety who presents to the clinic for 3 month follow up.   Pt is doing well. She has lost 20lbs in last 3 months. Her goal weight is 145lbs. She denies any CP, palpitations, headaches, or vision changes.   Her sleep is not great. She takes trazodone and/or klonapin to sleep.   Sees endocrinology. Not being checked regularly.   She has hit a plateau with weight loss and wonders if thyroid is in normal range. Taking synthroid daily.   .. Active Ambulatory Problems    Diagnosis Date Noted  . Hypothyroidism 03/08/2009  . Insomnia 02/13/2012  . Anxiety 02/13/2012  . Constipation 07/08/2013  . Hip bursitis 06/14/2014  . 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 DDD (degenerative disc disease), lumbar 06/14/2014  . Right-sided low back pain without sciatica 06/18/2014  . Piriformis syndrome 09/12/2014  . Muscle cramps 11/09/2014  . Edema of right lower extremity 11/09/2014  . Bruising 04/11/2015  . Class 2 obesity due to excess calories without serious comorbidity with body mass index (BMI) of 38.0 to 38.9 in adult 04/11/2015  . Abnormal weight gain 04/11/2015  . Eosinophilia 04/13/2015  . Bilateral lower extremity edema 07/16/2016  . Medial epicondylitis, left 06/07/2017  . Seasonal allergies 06/16/2018  . Acne vulgaris 06/16/2018  . Acute pain of right knee 06/16/2018  . Right wrist pain 06/16/2018  . Acute stress reaction 06/16/2018  . No energy 12/28/2019   Resolved Ambulatory Problems    Diagnosis Date Noted  . HAND PAIN, LEFT 03/08/2009  . 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 Acute sinusitis, unspecified 08/12/2010  . SOB (shortness of breath) 07/16/2016  . Weakness 07/16/2016  . Stress at work 02/25/2017  . Stress due to illness of family member 08/05/2019  . Elevated blood pressure  reading 12/28/2019   Past Medical History:  Diagnosis Date  . Lichen sclerosus   . Thyroid disease      Review of Systems  All other systems reviewed and are negative.      Objective:   Physical Exam Vitals reviewed.  Constitutional:      Appearance: Normal appearance. She is obese.  HENT:     Head: Normocephalic.  Cardiovascular:     Rate and Rhythm: Normal rate and regular rhythm.     Pulses: Normal pulses.  Pulmonary:     Effort: Pulmonary effort is normal.     Breath sounds: Normal breath sounds.  Musculoskeletal:     Cervical back: Normal range of motion and neck supple.  Neurological:     General: No focal deficit present.     Mental Status: She is alert and oriented to person, place, and time.  Psychiatric:        Mood and Affect: Mood normal.    .. Depression screen [facility] 2/9 01/30/2020 08/05/2019 01/12/2019 06/14/2018 08/31/2017  Decreased Interest 0 0 1 1 0  Down, Depressed, Hopeless 0 0 1 0 0  PHQ - 2 Score 0 0 2 1 0  Altered sleeping 0 0 2 3 0  Tired, decreased energy 0 1 0 1 0  Change in appetite 0 1 0 0 0  Feeling bad or failure about yourself  0 1 0 0 0  Trouble concentrating 0 0 0 3 0  Moving slowly or fidgety/restless 0 0  0 3 1  Suicidal thoughts 0 0 0 0 0  PHQ-9 Score 0 3 4 11 1   Difficult doing work/chores Not difficult at all Not difficult at all Not difficult at all Not difficult at all Not difficult at all   . GAD 7 : Generalized Anxiety Score 01/30/2020 08/05/2019 01/12/2019 06/14/2018  Nervous, Anxious, on Edge 0 3 3 3   Control/stop worrying 2 3 3 3   Worry too much - different things 2 0 3 3  Trouble relaxing 2 3 0 2  Restless 1 3 0 2  Easily annoyed or irritable 0 3 1 1   Afraid - awful might happen 2 3 3 3   Total GAD 7 Score 9 18 13 17   Anxiety Difficulty Not difficult at all Not difficult at all Not difficult at all Not difficult at all           Assessment & Plan:  06/16/2018 Brennan was seen today for weight check.  Diagnoses and all  orders for this visit:  Class 2 obesity due to excess calories without serious comorbidity with body mass index (BMI) of 38.0 to 38.9 in adult -     COMPLETE METABOLIC PANEL WITH GFR -     topiramate (TOPAMAX) 50 MG tablet; Take 1 tablet (50 mg total) by mouth 2 (two) times daily. -     phentermine 15 MG capsule; Take 1 capsule (15 mg total) by mouth every morning.  Acquired hypothyroidism -     Thyroid Panel With TSH -     COMPLETE METABOLIC PANEL WITH GFR  Primary insomnia -     traZODone (DESYREL) 100 MG tablet; Take one and one-half tablet daily for sleep.  Anxiety   For weight increased topamax to 50mg  bid continue with phentermine. Discussed exercising/walking 150 minutes a week.  .Discussed low carb diet with 1500 calories and 80g of protein.  Exercising at least 150 minutes a week.  My Fitness Pal could be a .   Increased trazodone for sleep to one and one-half tablet nightly. Discussed good sleep hygeine.   Recheck TSH and panel.   Anxiety controlled very sparingly use of klonapin.

## 2020-05-01 LAB — COMPLETE METABOLIC PANEL WITH GFR
AG Ratio: 1.8 (calc) (ref 1.0–2.5)
ALT: 19 U/L (ref 6–29)
AST: 18 U/L (ref 10–35)
Albumin: 4.4 g/dL (ref 3.6–5.1)
Alkaline phosphatase (APISO): 78 U/L (ref 37–153)
BUN: 24 mg/dL (ref 7–25)
CO2: 28 mmol/L (ref 20–32)
Calcium: 10.1 mg/dL (ref 8.6–10.4)
Chloride: 107 mmol/L (ref 98–110)
Creat: 0.7 mg/dL (ref 0.50–0.99)
GFR, Est African American: 106 mL/min/{1.73_m2} (ref >=60)
GFR, Est Non African American: 92 mL/min/{1.73_m2} (ref >=60)
Globulin: 2.4 g/dL (calc) (ref 1.9–3.7)
Glucose, Bld: 93 mg/dL (ref 65–99)
Potassium: 4.3 mmol/L (ref 3.5–5.3)
Sodium: 141 mmol/L (ref 135–146)
Total Bilirubin: 0.6 mg/dL (ref 0.2–1.2)
Total Protein: 6.8 g/dL (ref 6.1–8.1)

## 2020-05-01 LAB — THYROID PANEL WITH TSH
Free Thyroxine Index: 2.5 (ref 1.4–3.8)
T3 Uptake: 36 % — ABNORMAL HIGH (ref 22–35)
T4, Total: 6.9 ug/dL (ref 5.1–11.9)
TSH: 0.16 mIU/L — ABNORMAL LOW (ref 0.40–4.50)

## 2020-05-01 NOTE — Progress Notes (Signed)
Can we send thyroid results to endocrinologist?   Free T4 looks fine. TSH leaning more towards hyper thyroid side. I have not seen labs done at endocrinology. No major concerns.

## 2020-05-03 ENCOUNTER — Encounter: Payer: Self-pay | Admitting: Physician Assistant

## 2020-05-08 NOTE — Telephone Encounter (Signed)
Patient left vm stating she has reached out to Dr. Jimmye Norman about labs with no response. She is wanting to know if you could adjust medication? Please advise.

## 2020-05-09 MED ORDER — LEVOTHYROXINE SODIUM 112 MCG PO TABS: 112.0000 ug | ORAL_TABLET | Freq: Every day | ORAL | 1 refills | Status: DC

## 2020-05-09 NOTE — Telephone Encounter (Signed)
THYROID [facility]   Component 11/07/2019 11/07/2019 08/30/2019 08/30/2019 08/30/2019         T3, Free -- -- 2.6 -- --  Free T4 1.22 -- -- 1.33 --  TSH -- 0.488   0.688

## 2020-05-14 ENCOUNTER — Other Ambulatory Visit: Payer: Self-pay | Admitting: Physician Assistant

## 2020-05-14 DIAGNOSIS — E6609 Other obesity due to excess calories: Secondary | ICD-10-CM

## 2020-07-11 DIAGNOSIS — E038 Other specified hypothyroidism: Secondary | ICD-10-CM | POA: Diagnosis not present

## 2020-07-11 DIAGNOSIS — E063 Autoimmune thyroiditis: Secondary | ICD-10-CM | POA: Diagnosis not present

## 2020-07-26 ENCOUNTER — Other Ambulatory Visit: Payer: Self-pay | Admitting: Physician Assistant

## 2020-07-26 DIAGNOSIS — F419 Anxiety disorder, unspecified: Secondary | ICD-10-CM

## 2020-07-26 DIAGNOSIS — E6609 Other obesity due to excess calories: Secondary | ICD-10-CM

## 2020-07-26 NOTE — Telephone Encounter (Signed)
Phentermine last written 04/30/2020 #90 no refills Clonazepam last written 01/06/2020 #45 with 5 refills  Last appt 04/30/2020

## 2020-07-27 NOTE — Telephone Encounter (Signed)
Patient needs an appointment

## 2020-07-27 NOTE — Telephone Encounter (Signed)
Needs 3 month follow up if wanting to stay on phentermine or consider refill/other options.

## 2020-07-27 NOTE — Telephone Encounter (Signed)
Patient has an appointment scheduled for 07/31/20. AM

## 2020-07-31 ENCOUNTER — Other Ambulatory Visit: Payer: Self-pay

## 2020-07-31 ENCOUNTER — Ambulatory Visit: Payer: Federal, State, Local not specified - PPO | Admitting: Physician Assistant

## 2020-07-31 ENCOUNTER — Encounter: Payer: Self-pay | Admitting: Physician Assistant

## 2020-07-31 VITALS — BP 132/74 | HR 68 | Ht 67.0 in | Wt 219.0 lb

## 2020-07-31 DIAGNOSIS — H1013 Acute atopic conjunctivitis, bilateral: Secondary | ICD-10-CM | POA: Insufficient documentation

## 2020-07-31 DIAGNOSIS — Z6835 Body mass index (BMI) 35.0-35.9, adult: Secondary | ICD-10-CM | POA: Diagnosis not present

## 2020-07-31 DIAGNOSIS — E6609 Other obesity due to excess calories: Secondary | ICD-10-CM | POA: Diagnosis not present

## 2020-07-31 MED ORDER — PHENTERMINE HCL 15 MG PO CAPS: 15.0000 mg | ORAL_CAPSULE | ORAL | 0 refills | Status: DC

## 2020-07-31 MED ORDER — TOPIRAMATE 50 MG PO TABS: 50.0000 mg | ORAL_TABLET | Freq: Two times a day (BID) | ORAL | 1 refills | Status: DC

## 2020-07-31 MED ORDER — AZELASTINE HCL 0.05 % OP SOLN: 1.0000 [drp] | Freq: Two times a day (BID) | OPHTHALMIC | 0 refills | Status: DC

## 2020-07-31 NOTE — Progress Notes (Signed)
Subjective:    Patient ID: Kristy Jones, female    DOB: 04-20-1955, 65 y.o.   MRN: [id number]  HPI  Pt is a 65 yo obese female who presents to the clinic to follow up on weight.   She is on phentermine and topamax. She is doing well and lost another 6lbs. She denies any side effects or concerns. She is eating healthy but her portion sizes might be too big. She is swimming some and trying to stay active.   She did have a tinting and lifting procedure done on eye brown and lash 2 days ago. She thinks it got in her eyes. She is having lots of redness and irritation is both eyes with some watery discharge. Visine is helping some.   .. Active Ambulatory Problems    Diagnosis Date Noted  . Hypothyroidism 03/08/2009  . Insomnia 02/13/2012  . Anxiety 02/13/2012  . Constipation 07/08/2013  . Hip bursitis 06/14/2014  . 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 DDD (degenerative disc disease), lumbar 06/14/2014  . Right-sided low back pain without sciatica 06/18/2014  . Piriformis syndrome 09/12/2014  . Muscle cramps 11/09/2014  . Edema of right lower extremity 11/09/2014  . Bruising 04/11/2015  . Class 2 obesity due to excess calories without serious comorbidity with body mass index (BMI) of 38.0 to 38.9 in adult 04/11/2015  . Abnormal weight gain 04/11/2015  . Eosinophilia 04/13/2015  . Bilateral lower extremity edema 07/16/2016  . Medial epicondylitis, left 06/07/2017  . Seasonal allergies 06/16/2018  . Acne vulgaris 06/16/2018  . Acute pain of right knee 06/16/2018  . Right wrist pain 06/16/2018  . Acute stress reaction 06/16/2018  . No energy 12/28/2019  . Allergic conjunctivitis of both eyes 07/31/2020   Resolved Ambulatory Problems    Diagnosis Date Noted  . HAND PAIN, LEFT 03/08/2009  . 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 Acute sinusitis, unspecified 08/12/2010  . SOB (shortness of breath) 07/16/2016  . Weakness 07/16/2016  . Stress at work 02/25/2017   . Stress due to illness of family member 08/05/2019  . Elevated blood pressure reading 12/28/2019   Past Medical History:  Diagnosis Date  . Lichen sclerosus   . Thyroid disease         Review of Systems See HPI.     Objective:   Physical Exam Vitals reviewed.  Constitutional:      Appearance: Normal appearance. She is obese.  HENT:     Head: Normocephalic.  Eyes:     Comments: Bilateral injected conjunctiva with watery discharge.   Cardiovascular:     Rate and Rhythm: Normal rate and regular rhythm.  Pulmonary:     Effort: Pulmonary effort is normal.  Neurological:     General: No focal deficit present.     Mental Status: She is alert and oriented to person, place, and time.  Psychiatric:        Mood and Affect: Mood normal.           Assessment & Plan:  Marland KitchenMarland KitchenAlayza was seen today for weight check.  Diagnoses and all orders for this visit:  Class 2 obesity due to excess calories without serious comorbidity with body mass index (BMI) of 35.0 to 35.9 in adult -     phentermine 15 MG capsule; Take 1 capsule (15 mg total) by mouth every morning. -     topiramate (TOPAMAX) 50 MG tablet; Take 1 tablet (50 mg total) by mouth 2 (two) times daily.  Allergic  conjunctivitis of both eyes -     azelastine (OPTIVAR) 0.05 % ophthalmic solution; Place 1 drop into both eyes 2 (two) times daily.   Marland Kitchen.Discussed low carb diet with 1500 calories and 80g of protein.  Exercising at least 150 minutes a week.  My Fitness Pal could be a Chief Technology Officer.  Continue on phentermine/topamax.  Follow up in 3 months.   optivar for allergic conjunctivitis. Cool compresses. Benadryl for itching. HO given follow up as needed.

## 2020-07-31 NOTE — Patient Instructions (Signed)
Allergic Conjunctivitis, Adult  Allergic conjunctivitis is inflammation of the clear membrane (conjunctiva) that covers the white part of your eye and the inner surface of your eyelid. This condition can make your eye red or pink. It can also make your eye feel itchy. This condition cannot be spread from one person to another person (is not contagious). What are the causes? This condition is caused by allergens. These are things that can cause an allergic reaction in some people but not in others. Common allergens include:  Outdoor allergens, such as: ? Pollen, including pollen from grass and weeds. ? Mold. ? Car fumes.  Indoor allergens, such as: ? Dust. ? Smoke. ? Mold. ? Proteins in a pet's pee (urine), saliva, or dander. What increases the risk? You are more likely to develop this condition if you have a family history of these things:  Allergies.  Conditions that you get because of allergens, such as asthma or inflammation of the skin (eczema). What are the signs or symptoms? Symptoms of this condition include eyes that are:  Itchy.  Red.  Watery.  Puffy. Your eyes may also:  Sting or burn.  Have clear fluid draining from them.  Have thick mucus coming from them. How is this treated? This condition may be treated with:  Cold, wet cloths (cold compresses) to soothe itching and swelling.  Washing the face to remove allergens.  Eye drops. These may include: ? Eye drops that block allergies. ? Eye drops that reduce swelling and irritation. ? Steroid eye drops if other treatments have not worked.  Oral antihistamine medicines. These medicines lessen your allergies. You may need these if eye drops do not help or are difficult to use.   Follow these instructions at home: Eye care  Place a cool, clean washcloth on your eye for 10-20 minutes. Do this 3-4 times a day.  Do not touch or rub your eyes.  Do not wear contact lenses until the inflammation is gone.  Wear glasses instead.  Do not wear eye makeup until the inflammation is gone. General instructions  Try not to be around things that you are allergic to.  Take or apply over-the-counter and prescription medicines only as told by your doctor. These include any eye drops.  Drink enough fluid to keep your pee pale yellow.  Keep all follow-up visits as told by your doctor. This is important. Contact a doctor if:  Your symptoms get worse.  Your symptoms do not get better with treatment.  You have mild eye pain.  You are sensitive to light.  You have spots or blisters on your eyes.  You have pus coming from your eye.  You have a fever. Get help right away if:  You have redness, swelling, or other symptoms in only one eye.  You cannot see well.  You have other vision changes.  You have very bad eye pain. Summary  Allergic conjunctivitis is caused by allergens. It can make your eye red or pink, and it can make your eye feel itchy.  This condition cannot be spread from one person to another person (is not contagious).  Try not to be around things that you are allergic to.  Take or apply over-the-counter and prescription medicines only as told by your doctor. These include any eye drops.  Contact your doctor if your symptoms get worse or they do not get better with treatment. This information is not intended to replace advice given to you by your health care provider.   Make sure you discuss any questions you have with your health care provider. Document Revised: 01/03/2019 Document Reviewed: 01/03/2019 Elsevier Patient Education  2021 Elsevier Inc.  

## 2020-08-15 DIAGNOSIS — M79671 Pain in right foot: Secondary | ICD-10-CM | POA: Diagnosis not present

## 2020-08-15 DIAGNOSIS — L03115 Cellulitis of right lower limb: Secondary | ICD-10-CM | POA: Diagnosis not present

## 2020-09-11 ENCOUNTER — Encounter: Payer: Self-pay | Admitting: Physician Assistant

## 2020-09-13 DIAGNOSIS — Z20822 Contact with and (suspected) exposure to covid-19: Secondary | ICD-10-CM | POA: Diagnosis not present

## 2020-09-22 ENCOUNTER — Other Ambulatory Visit: Payer: Self-pay | Admitting: Physician Assistant

## 2020-09-22 DIAGNOSIS — M25561 Pain in right knee: Secondary | ICD-10-CM

## 2020-10-12 ENCOUNTER — Telehealth: Payer: Self-pay

## 2020-10-12 NOTE — Telephone Encounter (Signed)
Ok to get tattoo.

## 2020-10-12 NOTE — Telephone Encounter (Signed)
Patient called in . She would like to get a tattoo on her leg, however she has spider veins on left leg near ankle. She was told to call and make sure it is okay to get a tattoo over these. Please advise

## 2020-10-13 ENCOUNTER — Encounter: Payer: Self-pay | Admitting: Physician Assistant

## 2020-10-16 NOTE — Telephone Encounter (Signed)
Called patient and advised

## 2020-10-26 ENCOUNTER — Encounter: Payer: Self-pay | Admitting: Physician Assistant

## 2020-10-26 DIAGNOSIS — E6609 Other obesity due to excess calories: Secondary | ICD-10-CM

## 2020-10-26 DIAGNOSIS — Z6835 Body mass index (BMI) 35.0-35.9, adult: Secondary | ICD-10-CM

## 2020-10-29 ENCOUNTER — Other Ambulatory Visit: Payer: Self-pay | Admitting: Physician Assistant

## 2020-10-29 DIAGNOSIS — F419 Anxiety disorder, unspecified: Secondary | ICD-10-CM

## 2020-10-30 MED ORDER — PHENTERMINE HCL 15 MG PO CAPS: 15.0000 mg | ORAL_CAPSULE | ORAL | 0 refills | Status: DC

## 2020-10-30 NOTE — Telephone Encounter (Signed)
Last written 01/06/2020 #45 with 5 refills Last appt in June 2022

## 2020-11-06 ENCOUNTER — Other Ambulatory Visit: Payer: Self-pay

## 2020-11-06 ENCOUNTER — Ambulatory Visit (INDEPENDENT_AMBULATORY_CARE_PROVIDER_SITE_OTHER): Payer: Medicare Other | Admitting: Physician Assistant

## 2020-11-06 ENCOUNTER — Other Ambulatory Visit: Payer: Self-pay | Admitting: Physician Assistant

## 2020-11-06 VITALS — BP 124/70 | HR 74 | Ht 67.0 in | Wt 221.0 lb

## 2020-11-06 DIAGNOSIS — Z131 Encounter for screening for diabetes mellitus: Secondary | ICD-10-CM | POA: Diagnosis not present

## 2020-11-06 DIAGNOSIS — E6609 Other obesity due to excess calories: Secondary | ICD-10-CM | POA: Insufficient documentation

## 2020-11-06 DIAGNOSIS — E039 Hypothyroidism, unspecified: Secondary | ICD-10-CM | POA: Diagnosis not present

## 2020-11-06 DIAGNOSIS — Z13220 Encounter for screening for lipoid disorders: Secondary | ICD-10-CM

## 2020-11-06 DIAGNOSIS — Z6834 Body mass index (BMI) 34.0-34.9, adult: Secondary | ICD-10-CM

## 2020-11-06 MED ORDER — PHENTERMINE HCL 15 MG PO CAPS: 15.0000 mg | ORAL_CAPSULE | ORAL | 0 refills | Status: DC

## 2020-11-06 MED ORDER — WEGOVY 0.25 MG/0.5ML ~~LOC~~ SOAJ: 0.2500 mg | SUBCUTANEOUS | 0 refills | Status: DC

## 2020-11-06 NOTE — Progress Notes (Signed)
Subjective:    Patient ID: Kristy Jones, female    DOB: [birth year], 65 y.o.   MRN: [id number]  HPI Pt is a 65 yo obese female with hypothyroidism who presents to the clinic to follow up and discuss weight.   She sees endocrinology for thyroid management.   Pt has been slowly losing weight and maintaining with phentermine/topamax combination. No side effects. She did gain 4lbs this 3 months. No problems sleeping. She wonders what else she can do to lose weight.   .. 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/14/2014   DDD (degenerative disc disease), lumbar 06/14/2014   Right-sided low back pain without sciatica 06/18/2014   Piriformis syndrome 09/12/2014   Muscle cramps 11/09/2014   Edema of right lower extremity 11/09/2014   Bruising 04/11/2015   Abnormal weight gain 04/11/2015   Eosinophilia 04/13/2015   Bilateral lower extremity edema 07/16/2016   Medial epicondylitis, left 06/07/2017   Seasonal allergies 06/16/2018   Acne vulgaris 06/16/2018   Acute pain of right knee 06/16/2018   Right wrist pain 06/16/2018   Acute stress reaction 06/16/2018   No energy 12/28/2019   Allergic conjunctivitis of both eyes 07/31/2020   Class 1 obesity due to excess calories without serious comorbidity with body mass index (BMI) of 34.0 to 34.9 in adult 11/06/2020   Resolved Ambulatory Problems    Diagnosis Date Noted   HAND PAIN, LEFT 03/08/2009   EUSTACHIAN TUBE DYSFUNCTION, LEFT 08/12/2010   Acute sinusitis, unspecified 08/12/2010   Class 2 obesity due to excess calories without serious comorbidity with body mass index (BMI) of 38.0 to 38.9 in adult 04/11/2015   SOB (shortness of breath) 07/16/2016   Weakness 07/16/2016   Stress at work 02/25/2017   Stress due to illness of family member 08/05/2019   Elevated  blood pressure reading 12/28/2019   Past Medical History:  Diagnosis Date   Lichen sclerosus    Thyroid disease      Review of Systems See HPI.     Objective:   Physical Exam Vitals reviewed.  Constitutional:      Appearance: Normal appearance.  HENT:     Head: Normocephalic.  Cardiovascular:     Rate and Rhythm: Normal rate.  Pulmonary:     Effort: Pulmonary effort is normal.  Musculoskeletal:     Right lower leg: No edema.     Left lower leg: No edema.  Neurological:     General: No focal deficit present.     Mental Status: She is alert and oriented to person, place, and time.  Psychiatric:        Mood and Affect: Mood normal.          Assessment & Plan:  Marland KitchenMarland KitchenSheletha was seen today for follow-up.  Diagnoses and all orders for this visit:  Class 1 obesity due to excess calories without serious comorbidity with body mass index (BMI) of 34.0 to 34.9 in adult -     phentermine 15 MG capsule; Take 1 capsule (15 mg total) by mouth every morning. -     COMPLETE METABOLIC PANEL WITH GFR -     CBC with Differential/Platelet -     Semaglutide-Weight Management (WEGOVY) 0.25 MG/0.5ML SOAJ; Inject 0.25 mg into the skin once a week.  Screening for diabetes mellitus -  COMPLETE METABOLIC PANEL WITH GFR -     CBC with Differential/Platelet  Screening for lipid disorders -     Lipid Panel w/reflex Direct LDL -     CBC with Differential/Platelet  Acquired hypothyroidism  Follow up with endocrinology to make sure thyroid is where it should be.  Get screening labs done.  Refilled topamax/phentermine. Discussed 5 (100 calorie) snacks daily with 2 healthy meals.  Walk 30 minutes at least 5 day a week.  Added wegovy. Discussed side effects.  Concern cost. Will discuss titration if able to afford.

## 2020-11-07 ENCOUNTER — Encounter: Payer: Self-pay | Admitting: Physician Assistant

## 2020-11-07 LAB — CBC WITH DIFFERENTIAL/PLATELET
Absolute Monocytes: 576 cells/uL (ref 200–950)
Basophils Absolute: 61 cells/uL (ref 0–200)
Basophils Relative: 1.2 %
Eosinophils Absolute: 301 cells/uL (ref 15–500)
Eosinophils Relative: 5.9 %
HCT: 42.3 % (ref 35.0–45.0)
Hemoglobin: 14.2 g/dL (ref 11.7–15.5)
Lymphs Abs: 1734 cells/uL (ref 850–3900)
MCH: 30.6 pg (ref 27.0–33.0)
MCHC: 33.6 g/dL (ref 32.0–36.0)
MCV: 91.2 fL (ref 80.0–100.0)
MPV: 10.7 fL (ref 7.5–12.5)
Monocytes Relative: 11.3 %
Neutro Abs: 2428 cells/uL (ref 1500–7800)
Neutrophils Relative %: 47.6 %
Platelets: 251 10*3/uL (ref 140–400)
RBC: 4.64 10*6/uL (ref 3.80–5.10)
RDW: 12 % (ref 11.0–15.0)
Total Lymphocyte: 34 %
WBC: 5.1 10*3/uL (ref 3.8–10.8)

## 2020-11-07 LAB — LIPID PANEL W/REFLEX DIRECT LDL
Cholesterol: 191 mg/dL (ref <200)
HDL: 70 mg/dL (ref >=50)
LDL Cholesterol (Calc): 107 mg/dL (calc) — ABNORMAL HIGH
Non-HDL Cholesterol (Calc): 121 mg/dL (calc) (ref <130)
Total CHOL/HDL Ratio: 2.7 (calc) (ref <5.0)
Triglycerides: 57 mg/dL (ref <150)

## 2020-11-07 LAB — COMPLETE METABOLIC PANEL WITH GFR
AG Ratio: 1.8 (calc) (ref 1.0–2.5)
ALT: 12 U/L (ref 6–29)
AST: 16 U/L (ref 10–35)
Albumin: 4.5 g/dL (ref 3.6–5.1)
Alkaline phosphatase (APISO): 63 U/L (ref 37–153)
BUN: 24 mg/dL (ref 7–25)
CO2: 26 mmol/L (ref 20–32)
Calcium: 9.8 mg/dL (ref 8.6–10.4)
Chloride: 107 mmol/L (ref 98–110)
Creat: 0.7 mg/dL (ref 0.50–1.05)
Globulin: 2.5 g/dL (calc) (ref 1.9–3.7)
Glucose, Bld: 106 mg/dL — ABNORMAL HIGH (ref 65–99)
Potassium: 5.1 mmol/L (ref 3.5–5.3)
Sodium: 139 mmol/L (ref 135–146)
Total Bilirubin: 0.6 mg/dL (ref 0.2–1.2)
Total Protein: 7 g/dL (ref 6.1–8.1)
eGFR: 96 mL/min/{1.73_m2} (ref >=60)

## 2020-11-07 NOTE — Progress Notes (Signed)
Shawni,   HDL, good cholesterol, looks fabulous.  LDL, bad cholesterol, looks ok. Not quite to goal of under 100.  Fasting sugar a little elevated. JJ, please add A1C to better evaluate for diabetes.  Kidney and liver look good.

## 2020-11-09 ENCOUNTER — Encounter: Payer: Self-pay | Admitting: Physician Assistant

## 2020-12-26 ENCOUNTER — Other Ambulatory Visit: Payer: Self-pay | Admitting: Physician Assistant

## 2021-01-14 LAB — HM MAMMOGRAPHY

## 2021-01-27 ENCOUNTER — Encounter: Payer: Self-pay | Admitting: Physician Assistant

## 2021-01-28 NOTE — Telephone Encounter (Signed)
Fax sent to [facility] requesting records for pap and mammogram.  Tiajuana Amass, CMA

## 2021-02-11 ENCOUNTER — Telehealth: Payer: Self-pay | Admitting: General Practice

## 2021-02-11 NOTE — Telephone Encounter (Signed)
Transition Care Management Follow-up Telephone Call Date of discharge and from where: 02/10/21 from [facility] How have you been since you were released from the hospital? Doing ok. Questionable dental abscess. Has an appt with a different dentist tomorrow (02/12/21). Any questions or concerns? No  Items Reviewed: Did the pt receive and understand the discharge instructions provided? Yes  Medications obtained and verified? No  Other? No  Any new allergies since your discharge? No  Dietary orders reviewed? Yes Do you have support at home? No   Home Care and Equipment/Supplies: Were home health services ordered? no  Functional Questionnaire: (I = Independent and D = Dependent) ADLs: I  Bathing/Dressing- I  Meal Prep- I  Eating- I  Maintaining continence- I  Transferring/Ambulation- I  Managing Meds- I  Follow up appointments reviewed:  PCP Hospital f/u appt confirmed? No   Specialist Hospital f/u appt confirmed? Yes  Scheduled to see Dentist on 02/12/21 at 1040. Are transportation arrangements needed? No  If their condition worsens, is the pt aware to call PCP or go to the Emergency Dept.? Yes Was the patient provided with contact information for the PCP's office or ED? Yes Was to pt encouraged to call back with questions or concerns? Yes

## 2021-02-28 ENCOUNTER — Other Ambulatory Visit: Payer: Self-pay | Admitting: Physician Assistant

## 2021-02-28 DIAGNOSIS — Z6834 Body mass index (BMI) 34.0-34.9, adult: Secondary | ICD-10-CM

## 2021-02-28 DIAGNOSIS — E6609 Other obesity due to excess calories: Secondary | ICD-10-CM

## 2021-03-04 NOTE — Telephone Encounter (Signed)
Pt stated she wasn't sure if she wanted a refill on Phentermine, she has Topamax and wanted to continue this for a while and see how it goes and will make an appt later on if needed. AM

## 2021-03-04 NOTE — Telephone Encounter (Signed)
Please schedule pt for an appt

## 2021-03-26 ENCOUNTER — Encounter: Payer: Medicare Other | Admitting: Physician Assistant

## 2021-04-01 ENCOUNTER — Telehealth: Payer: Self-pay | Admitting: General Practice

## 2021-04-01 NOTE — Telephone Encounter (Signed)
Transition Care Management Follow-up Telephone Call Date of discharge and from where: 03/30/21 from [facility]  How have you been since you were released from the hospital? Started antibiotics and is feeling a little better. Any questions or concerns? No  Items Reviewed: Did the pt receive and understand the discharge instructions provided? Yes  Medications obtained and verified? No  Other? No  Any new allergies since your discharge? No  Dietary orders reviewed? Yes Do you have support at home? Yes   Home Care and Equipment/Supplies: Were home health services ordered? no  Functional Questionnaire: (I = Independent and D = Dependent) ADLs: I  Bathing/Dressing- I  Meal Prep- I  Eating- I  Maintaining continence- I  Transferring/Ambulation- I  Managing Meds- I  Follow up appointments reviewed:  PCP Hospital f/u appt confirmed? No  Patient will call back to schedule. Specialist Hospital f/u appt confirmed? No   Are transportation arrangements needed? No  If their condition worsens, is the pt aware to call PCP or go to the Emergency Dept.? Yes Was the patient provided with contact information for the PCP's office or ED? Yes Was to pt encouraged to call back with questions or concerns? Yes

## 2021-04-02 ENCOUNTER — Other Ambulatory Visit: Payer: Self-pay | Admitting: Physician Assistant

## 2021-04-02 DIAGNOSIS — M25561 Pain in right knee: Secondary | ICD-10-CM

## 2021-07-29 ENCOUNTER — Telehealth: Payer: Self-pay | Admitting: Physician Assistant

## 2021-07-29 NOTE — Telephone Encounter (Signed)
Pt received a massage and felt a sharp pain in the back of her leg with minimal pressure. The patient is concerned because she had a blood clot in her leg before back in 2021. I have her scheduled for 6/9 Jade's 1st available appt but she wants to know if she should come in earlier.

## 2021-07-30 ENCOUNTER — Encounter: Payer: Self-pay | Admitting: Physician Assistant

## 2021-07-30 ENCOUNTER — Ambulatory Visit (INDEPENDENT_AMBULATORY_CARE_PROVIDER_SITE_OTHER): Payer: Medicare Other | Admitting: Physician Assistant

## 2021-07-30 VITALS — BP 124/78 | HR 62 | Ht 67.0 in | Wt 230.0 lb

## 2021-07-30 DIAGNOSIS — Z6836 Body mass index (BMI) 36.0-36.9, adult: Secondary | ICD-10-CM | POA: Diagnosis not present

## 2021-07-30 DIAGNOSIS — F419 Anxiety disorder, unspecified: Secondary | ICD-10-CM | POA: Diagnosis not present

## 2021-07-30 DIAGNOSIS — E6609 Other obesity due to excess calories: Secondary | ICD-10-CM | POA: Diagnosis not present

## 2021-07-30 DIAGNOSIS — M79662 Pain in left lower leg: Secondary | ICD-10-CM | POA: Diagnosis not present

## 2021-07-30 MED ORDER — CLONAZEPAM 0.5 MG PO TABS: 0.5000 mg | ORAL_TABLET | Freq: Two times a day (BID) | ORAL | 5 refills | Status: DC | PRN

## 2021-07-30 MED ORDER — TOPIRAMATE 50 MG PO TABS: 50.0000 mg | ORAL_TABLET | Freq: Two times a day (BID) | ORAL | 3 refills | Status: DC

## 2021-07-30 MED ORDER — PHENTERMINE HCL 15 MG PO CAPS: 15.0000 mg | ORAL_CAPSULE | ORAL | 0 refills | Status: DC

## 2021-07-30 NOTE — Progress Notes (Signed)
Acute Office Visit  Subjective:     Patient ID: Kristy Jones, female    DOB: 1955-08-18, 66 y.o.   MRN: [id number]  Chief Complaint  Patient presents with   Leg Pain    Leg Pain   Patient is in today for evaluation of a tender upper left calf. Patient reports increased tenderness over the upper left calf during a deep tissue massage that took place last week. Pain has been constant without radiation, and is worse with movement and palpation. She currently takes Mobic for pain, but this has not helped. Patient denies any recent immobility, trauma, surgery, or coagulation disorders. She denies any fevers, chills, shortness of breath, redness, swelling, or weakness. She does admit to moving and being more active.   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   She is frustrated with her weight. She was on phentermine and topamax but ran out.   Her anxiety is controlled. Needs refills on klonapin as needed.   Review of Systems  Musculoskeletal:        Left Calf Pain  All other systems reviewed and are negative.      Objective:    BP 124/78   Pulse 62   Ht 5\' 7"  (1.702 m)   Wt 104.3 kg   SpO2 99%   BMI 36.02 kg/m  BP Readings from Last 3 Encounters:  07/30/21 124/78  11/06/20 124/70  07/31/20 132/74   Wt Readings from Last 3 Encounters:  07/30/21 230 lb (104.3 kg)  11/06/20 221 lb (100.2 kg)  07/31/20 219 lb (99.3 kg)      Physical Exam Constitutional:      Appearance: Normal appearance. She is not ill-appearing.  HENT:     Head: Normocephalic and atraumatic.  Eyes:     Extraocular Movements: Extraocular movements intact.  Cardiovascular:     Rate and Rhythm: Normal rate and regular rhythm.     Pulses: Normal pulses.     Heart sounds: Normal heart sounds.  Pulmonary:     Effort: Pulmonary effort is normal.     Breath sounds: Normal breath sounds.  Musculoskeletal:        General: Tenderness present. No swelling, deformity or signs of injury. Normal range of  motion.     Right lower leg: No edema.     Left lower leg: No edema.     Comments: Point tenderness noted over the superior aspect of the left calf.   Skin:    General: Skin is warm and dry.     Capillary Refill: Capillary refill takes less than 2 seconds.     Findings: No erythema.  Neurological:     Mental Status: She is alert and oriented to person, place, and time.     Motor: No weakness.     Gait: Gait normal.  Psychiatric:        Mood and Affect: Mood normal.        Behavior: Behavior normal.         Assessment & Plan:   Kristy Jones was seen today for leg pain.  Diagnoses and all orders for this visit:  Pain of left calf  Class 2 obesity due to excess calories without serious comorbidity with body mass index (BMI) of 35.0 to 35.9 in adult -     topiramate (TOPAMAX) 50 MG tablet; Take 1 tablet (50 mg total) by mouth 2 (two) times daily.  Class 1 obesity due to excess calories without serious comorbidity with body mass index (  BMI) of 34.0 to 34.9 in adult -     phentermine 15 MG capsule; Take 1 capsule (15 mg total) by mouth every morning.  Anxiety -     clonazePAM (KLONOPIN) 0.5 MG tablet; Take 1 tablet (0.5 mg total) by mouth 2 (two) times daily as needed for anxiety.   Very low suspicious of DVT with no risk factors/swelling/warmth Red flag signs and symptoms discussed as well as when to come back if needed Discussed gastrocnemius strain HO given  Sample of voltaren gel Encouraged icing Gave exercises with stretch band Follow up if worsening or not improving  Refilled klonapin.  No concerns  .Marland KitchenDiscussed low carb diet with 1500 calories and 80g of protein.  Exercising at least 150 minutes a week.  [employer] could be a Chief Technology Officer.  Pt has gained weight Restart topamax and phentermine.  2nd BP looked great.   Return if symptoms worsen or fail to improve.

## 2021-07-30 NOTE — Patient Instructions (Signed)
Medial Head Gastrocnemius Tear Rehab Ask your health care provider which exercises are safe for you. Do exercises exactly as told by your health care provider and adjust them as directed. It is normal to feel mild stretching, pulling, tightness, or discomfort as you do these exercises. Stop right away if you feel sudden pain or your pain gets worse. Do not begin these exercises until told by your health care provider. Stretching and range-of-motion exercises These exercises warm up your muscles and joints and improve the movement and flexibility of your lower leg. These exercises also help to relieve pain and stiffness. Gastrocnemius stretch This exercise is also called a calf stretch. It stretches the muscles in the back of the lower leg (gastrocnemius). Sit with your left / right leg extended. Loop a belt or towel around the ball of your left / right foot. The ball of your foot is on the walking surface, right under your toes. Hold both ends of the belt or towel. Keep your left / right ankle and foot relaxed and keep your knee straight while you use the belt or towel to pull your foot and ankle toward you. Stop at the first point of resistance. Hold this position for __________ seconds. Repeat __________ times. Complete this exercise __________ times a day. Ankle alphabet  Sit with your left / right leg supported at the lower leg. Do not rest your foot on anything. Make sure your foot has room to move freely. Think of your left / right foot as a paintbrush. Move your foot to trace each letter of the alphabet in the air. Keep your hip and knee still while you trace. Make the letters as large as you can without feeling discomfort. Trace every letter of the alphabet. Repeat __________ times. Complete this exercise __________ times a day. Strengthening exercises These exercises build strength and endurance in your lower leg. Endurance is the ability to use your muscles for a long time, even  after they get tired. Plantar flexion with band, seated  Sit on the floor with your left / right leg extended. Loop a rubber exercise band or tube around the ball of your left / right foot. The ball of your foot is on the walking surface, right under your toes. The band or tube should be slightly tense when your foot is relaxed. If the band or tube slips, you can put on your shoe or put a washcloth between the band and your foot to help it stay in place. While holding both ends of the band or tube, slowly point your toes downward, pushing them away from you (plantar flexion). Hold this position for __________ seconds. Slowly release the tension in the band or tube, controlling smoothly until your foot is back to the starting position. Repeat __________ times. Complete this exercise __________ times a day. Plantar flexion, standing  Stand with your feet shoulder-width apart. Place your hands on a wall or table to steady yourself as needed, but try not to use it for support. Rise up on your toes (plantar flexion). If this exercise is too easy, try these options: Shift your weight toward your left / right leg until you feel challenged. If told by your health care provider, stand on your left / right foot only. Hold this position for __________ seconds. Repeat __________ times. Complete this exercise __________ times a day. Eccentric plantar flexion  Stand on the balls of your feet on the edge of a step. The ball of your foot is  on the walking surface, right under your toes. Do not put your heels on the step. For balance, rest your hands on the wall or on a railing. Try not to lean on it for support. Rise up onto the balls of your feet, using both legs to help. Keeping your heels up, slowly shift all of your weight to your left / right foot and lift your other foot off the step. Slowly lower your left / right heel so it drops below the level of the step. Lowering your heel under tension is  called eccentric plantar flexion. You will feel a slight stretch in your left / right calf. Put your other foot back onto the step before returning to the start position. Repeat __________ times. Complete this exercise __________ times a day. This information is not intended to replace advice given to you by your health care provider. Make sure you discuss any questions you have with your health care provider. Document Revised: 08/07/2020 Document Reviewed: 08/07/2020 Elsevier Patient Education  2023 [employer].

## 2021-07-31 ENCOUNTER — Telehealth: Payer: Self-pay

## 2021-07-31 ENCOUNTER — Encounter: Payer: Self-pay | Admitting: Physician Assistant

## 2021-07-31 NOTE — Telephone Encounter (Addendum)
Initiated Prior authorization NLZ:[id number] 15mg  tab Via: Covermymeds Case/Key:[id number] Status: approved  as of 07/31/21 Reason:m 07/01/2021 through 01/27/2022 Notified Pt via: Mychart

## 2021-08-02 ENCOUNTER — Ambulatory Visit: Payer: Federal, State, Local not specified - PPO | Admitting: Physician Assistant

## 2021-08-21 ENCOUNTER — Telehealth: Payer: Self-pay | Admitting: Neurology

## 2021-08-21 NOTE — Telephone Encounter (Signed)
Patient left vm wanting all pharmacies except [facility] in [place] removed from her chart. Pharmacy set as instructed.

## 2021-08-30 ENCOUNTER — Encounter: Payer: Self-pay | Admitting: Physician Assistant

## 2021-08-30 ENCOUNTER — Ambulatory Visit (INDEPENDENT_AMBULATORY_CARE_PROVIDER_SITE_OTHER): Payer: Medicare Other | Admitting: Physician Assistant

## 2021-08-30 VITALS — BP 132/64 | HR 70 | Ht 67.0 in | Wt 223.0 lb

## 2021-08-30 DIAGNOSIS — Z0000 Encounter for general adult medical examination without abnormal findings: Secondary | ICD-10-CM | POA: Diagnosis not present

## 2021-08-30 DIAGNOSIS — Z13220 Encounter for screening for lipoid disorders: Secondary | ICD-10-CM

## 2021-08-30 DIAGNOSIS — E6609 Other obesity due to excess calories: Secondary | ICD-10-CM

## 2021-08-30 DIAGNOSIS — Z23 Encounter for immunization: Secondary | ICD-10-CM | POA: Diagnosis not present

## 2021-08-30 DIAGNOSIS — Z131 Encounter for screening for diabetes mellitus: Secondary | ICD-10-CM

## 2021-08-30 DIAGNOSIS — Z6836 Body mass index (BMI) 36.0-36.9, adult: Secondary | ICD-10-CM

## 2021-08-30 MED ORDER — PHENTERMINE HCL 15 MG PO CAPS: 15.0000 mg | ORAL_CAPSULE | ORAL | 0 refills | Status: DC

## 2021-08-30 NOTE — Progress Notes (Signed)
Subjective:    Kristy Jones is a 66 y.o. female who presents for a Welcome to Medicare exam.   Review of Systems:   Her oldest son is giving her some problems. He is not homeless in [facility]. He is hooked on drugs and alcohol. She is struggling with what to do. No SI/HC.      Objective:    Today's Vitals   08/30/21 1037  BP: 132/64  Pulse: 70  SpO2: 99%  Weight: 223 lb (101.2 kg)  Height: 5\' 7"  (1.702 m)  Body mass index is 34.93 kg/m.  Medications Outpatient Encounter Medications as of 08/30/2021  Medication Sig   Adapalene 0.3 % gel APPLY TOPICALLY EVERY NIGHT AT BEDTIME   Ascorbic Acid (VITAMIN C) 1000 MG tablet Take 1,000 mg by mouth daily.   azelastine (OPTIVAR) 0.05 % ophthalmic solution Place 1 drop into both eyes 2 (two) times daily.   b complex vitamins capsule Take 1 capsule by mouth daily.   cetirizine (ZYRTEC) 10 MG tablet Take 10 mg by mouth daily.   Cholecalciferol (VITAMIN D-3 PO) Take 2,000 Units by mouth daily.    clobetasol cream (TEMOVATE) 0.05 % Apply 1 application topically 2 (two) times daily.   clonazePAM (KLONOPIN) 0.5 MG tablet Take 1 tablet (0.5 mg total) by mouth 2 (two) times daily as needed for anxiety.   ibuprofen (ADVIL) 800 MG tablet TAKE 1 TABLET BY MOUTH EVERY 8 HOURS AS NEEDED   levothyroxine (SYNTHROID) 112 MCG tablet Take 1 tablet (112 mcg total) by mouth daily.   liothyronine (CYTOMEL) 5 MCG tablet Take 5 mcg by mouth daily.   Magnesium 500 MG CAPS Take by mouth. Take one in the am and one in the pm   meloxicam (MOBIC) 15 MG tablet TAKE 1 TABLET BY MOUTH EVERY DAY   Omega-3 Fatty Acids (FISH OIL) 1000 MG CAPS Take by mouth.   phentermine 15 MG capsule Take 1 capsule (15 mg total) by mouth every morning.   topiramate (TOPAMAX) 50 MG tablet Take 1 tablet (50 mg total) by mouth 2 (two) times daily.   zinc gluconate 50 MG tablet Take 25 mg by mouth daily.    [DISCONTINUED] phentermine 15 MG capsule Take 1 capsule (15 mg total) by mouth  every morning.   [DISCONTINUED] traZODone (DESYREL) 100 MG tablet Take one and one-half tablet daily for sleep.   No facility-administered encounter medications on file as of 08/30/2021.     History: Past Medical History:  Diagnosis Date   Lichen sclerosus    Thyroid disease    Past Surgical History:  Procedure Laterality Date   c-sections     SEPTOPLASTY      Family History  Problem Relation Age of Onset   Alcohol abuse Other        grandfather   Prostate cancer Father    Depression Mother        sister   Social History   Occupational History   Not on file  Tobacco Use   Smoking status: Former    Years: 36.00    Types: Cigarettes    Quit date: 06/06/2007    Years since quitting: 14.2   Smokeless tobacco: Never  Vaping Use   Vaping Use: Never used  Substance and Sexual Activity   Alcohol use: Yes    Alcohol/week: 6.0 standard drinks of alcohol    Types: 6 Standard drinks or equivalent per week   Drug use: No   Sexual activity: Not Currently  Tobacco Counseling Counseling given: Not Answered   Immunizations and Health Maintenance Immunization History  Administered Date(s) Administered   Influenza,inj,Quad PF,6+ Mos 12/29/2012   Influenza-Unspecified 11/03/2013, 12/18/2017, 11/29/2018   Janssen (J&J) SARS-COV-2 Vaccination 11/23/2019   PNEUMOCOCCAL CONJUGATE-20 08/30/2021   Tdap 03/24/2014   There are no preventive care reminders to display for this patient.   Activities of Daily Living    08/30/2021   11:00 AM  In your present state of health, do you have any difficulty performing the following activities:  Hearing? 0  Vision? 1  Difficulty concentrating or making decisions? 0  Walking or climbing stairs? 0  Dressing or bathing? 0  Doing errands, shopping? 0  Preparing Food and eating ? N  Using the Toilet? N  In the past six months, have you accidently leaked urine? N  Do you have problems with loss of bowel control? N  Managing your  Medications? N  Managing your Finances? N  Housekeeping or managing your Housekeeping? N    Physical Exam    Advanced Directives:      Assessment:    This is a routine wellness examination for this patient .   Dietary issues and exercise activities discussed:  Current Exercise Habits: Home exercise routine    Depression Screen    07/30/2021    9:39 AM 11/06/2020   11:36 AM 01/30/2020   10:26 AM 08/05/2019    8:49 AM  PHQ 2/9 Scores  PHQ - 2 Score 0 1 0 0  PHQ- 9 Score  2 0 3     Fall Risk    11/06/2020   11:36 AM  Fall Risk   Falls in the past year? 0  Number falls in past yr: 0  Injury with Fall? 0  Risk for fall due to : No Fall Risks  Follow up Falls evaluation completed    Cognitive Function:        08/30/2021   10:37 AM  6CIT Screen  What Year? 0 points  What month? 0 points  What time? 0 points  Count back from 20 0 points  Months in reverse 0 points  Repeat phrase 2 points  Total Score 2 points    Patient Care Team: Nolene Ebbs as PCP - General (Family Medicine) Lowella Grip, MD (Endocrinology)     Plan:     I have personally reviewed and noted the following in the patient's chart:   Medical and social history Use of alcohol, tobacco or illicit drugs  Current medications and supplements Functional ability and status Nutritional status Physical activity Advanced directives List of other physicians Hospitalizations, surgeries, and ER visits in previous 12 months Vitals Screenings to include cognitive, depression, and falls Referrals and appointments  In addition, I have reviewed and discussed with patient certain preventive protocols, quality metrics, and best practice recommendations. A written personalized care plan for preventive services as well as general preventive health recommendations were provided to patient.    .. Discussed 150 minutes of exercise a week.  Encouraged vitamin D 1000 units and Calcium 1300mg  or  4 servings of dairy a day.  Fasting labs ordered today Thyroid checked at endocrinology Pt is tearful but denies any medication PHq 0 Pneumonia 20 given today. Declines bone density Mammogram and Pap managed by GYN.  , PA-C 09/10/2021

## 2021-08-30 NOTE — Patient Instructions (Signed)
Health Maintenance After Age 65 After age 65, you are at a higher risk for certain long-term diseases and infections as well as injuries from falls. Falls are a major cause of broken bones and head injuries in people who are older than age 65. Getting regular preventive care can help to keep you healthy and well. Preventive care includes getting regular testing and making lifestyle changes as recommended by your health care provider. Talk with your health care provider about: Which screenings and tests you should have. A screening is a test that checks for a disease when you have no symptoms. A diet and exercise plan that is right for you. What should I know about screenings and tests to prevent falls? Screening and testing are the best ways to find a health problem early. Early diagnosis and treatment give you the best chance of managing medical conditions that are common after age 65. Certain conditions and lifestyle choices may make you more likely to have a fall. Your health care provider may recommend: Regular vision checks. Poor vision and conditions such as cataracts can make you more likely to have a fall. If you wear glasses, make sure to get your prescription updated if your vision changes. Medicine review. Work with your health care provider to regularly review all of the medicines you are taking, including over-the-counter medicines. Ask your health care provider about any side effects that may make you more likely to have a fall. Tell your health care provider if any medicines that you take make you feel dizzy or sleepy. Strength and balance checks. Your health care provider may recommend certain tests to check your strength and balance while standing, walking, or changing positions. Foot health exam. Foot pain and numbness, as well as not wearing proper footwear, can make you more likely to have a fall. Screenings, including: Osteoporosis screening. Osteoporosis is a condition that causes  the bones to get weaker and break more easily. Blood pressure screening. Blood pressure changes and medicines to control blood pressure can make you feel dizzy. Depression screening. You may be more likely to have a fall if you have a fear of falling, feel depressed, or feel unable to do activities that you used to do. Alcohol use screening. Using too much alcohol can affect your balance and may make you more likely to have a fall. Follow these instructions at home: Lifestyle Do not drink alcohol if: Your health care provider tells you not to drink. If you drink alcohol: Limit how much you have to: 0-1 drink a day for women. 0-2 drinks a day for men. Know how much alcohol is in your drink. In the [place] one drink equals one 12 oz bottle of beer (355 mL), one 5 oz glass of wine (148 mL), or one 1 oz glass of hard liquor (44 mL). Do not use any products that contain nicotine or tobacco. These products include cigarettes, chewing tobacco, and vaping devices, such as e-cigarettes. If you need help quitting, ask your health care provider. Activity  Follow a regular exercise program to stay fit. This will help you maintain your balance. Ask your health care provider what types of exercise are appropriate for you. If you need a cane or walker, use it as recommended by your health care provider. Wear supportive shoes that have nonskid soles. Safety  Remove any tripping hazards, such as rugs, cords, and clutter. Install safety equipment such as grab bars in bathrooms and safety rails on stairs. Keep rooms and walkways   well-lit. General instructions Talk with your health care provider about your risks for falling. Tell your health care provider if: You fall. Be sure to tell your health care provider about all falls, even ones that seem minor. You feel dizzy, tiredness (fatigue), or off-balance. Take over-the-counter and prescription medicines only as told by your health care provider. These include  supplements. Eat a healthy diet and maintain a healthy weight. A healthy diet includes low-fat dairy products, low-fat (lean) meats, and fiber from whole grains, beans, and lots of fruits and vegetables. Stay current with your vaccines. Schedule regular health, dental, and eye exams. Summary Having a healthy lifestyle and getting preventive care can help to protect your health and wellness after age 65. Screening and testing are the best way to find a health problem early and help you avoid having a fall. Early diagnosis and treatment give you the best chance for managing medical conditions that are more common for people who are older than age 65. Falls are a major cause of broken bones and head injuries in people who are older than age 65. Take precautions to prevent a fall at home. Work with your health care provider to learn what changes you can make to improve your health and wellness and to prevent falls. This information is not intended to replace advice given to you by your health care provider. Make sure you discuss any questions you have with your health care provider. Document Revised: 07/02/2020 Document Reviewed: 07/02/2020 Elsevier Patient Education  2023 [employer] Inc[employer]  

## 2021-08-31 LAB — LIPID PANEL W/REFLEX DIRECT LDL
Cholesterol: 211 mg/dL — ABNORMAL HIGH (ref <200)
HDL: 74 mg/dL (ref >=50)
LDL Cholesterol (Calc): 121 mg/dL (calc) — ABNORMAL HIGH
Non-HDL Cholesterol (Calc): 137 mg/dL (calc) — ABNORMAL HIGH (ref <130)
Total CHOL/HDL Ratio: 2.9 (calc) (ref <5.0)
Triglycerides: 72 mg/dL (ref <150)

## 2021-08-31 LAB — COMPLETE METABOLIC PANEL WITH GFR
AG Ratio: 1.7 (calc) (ref 1.0–2.5)
ALT: 10 U/L (ref 6–29)
AST: 15 U/L (ref 10–35)
Albumin: 4.3 g/dL (ref 3.6–5.1)
Alkaline phosphatase (APISO): 65 U/L (ref 37–153)
BUN: 17 mg/dL (ref 7–25)
CO2: 23 mmol/L (ref 20–32)
Calcium: 9.6 mg/dL (ref 8.6–10.4)
Chloride: 109 mmol/L (ref 98–110)
Creat: 0.82 mg/dL (ref 0.50–1.05)
Globulin: 2.5 g/dL (calc) (ref 1.9–3.7)
Glucose, Bld: 85 mg/dL (ref 65–99)
Potassium: 3.9 mmol/L (ref 3.5–5.3)
Sodium: 141 mmol/L (ref 135–146)
Total Bilirubin: 0.7 mg/dL (ref 0.2–1.2)
Total Protein: 6.8 g/dL (ref 6.1–8.1)
eGFR: 79 mL/min/{1.73_m2} (ref >=60)

## 2021-09-02 NOTE — Progress Notes (Signed)
Kristy Jones,   Good cholesterol looks great.  LDL, bad cholesterol, looks ok but up from 1 year ago.   Marland Kitchen.The 10-year ASCVD risk score (Arnett DK, et al., 2019) is: 5.4%   Values used to calculate the score:     Age: 65 years     Sex: Female     Is Non-Hispanic African American: No     Diabetic: No     Tobacco smoker: No     Systolic Blood Pressure: 132 mmHg     Is BP treated: No     HDL Cholesterol: 74 mg/dL     Total Cholesterol: 211 mg/dL  Overall risk still below 7.5 percent. Which is good.   Kidney, liver, glucose look great.   Continue to exercise daily and keep healthy diet.

## 2021-09-04 DIAGNOSIS — H04129 Dry eye syndrome of unspecified lacrimal gland: Secondary | ICD-10-CM | POA: Insufficient documentation

## 2021-09-04 DIAGNOSIS — H269 Unspecified cataract: Secondary | ICD-10-CM | POA: Insufficient documentation

## 2021-09-05 ENCOUNTER — Encounter: Payer: Self-pay | Admitting: Physician Assistant

## 2021-09-18 ENCOUNTER — Other Ambulatory Visit: Payer: Self-pay | Admitting: Physician Assistant

## 2021-09-18 DIAGNOSIS — M25561 Pain in right knee: Secondary | ICD-10-CM

## 2021-11-01 ENCOUNTER — Encounter: Payer: Self-pay | Admitting: Physician Assistant

## 2021-11-01 DIAGNOSIS — E6609 Other obesity due to excess calories: Secondary | ICD-10-CM

## 2021-11-04 MED ORDER — PHENTERMINE HCL 15 MG PO CAPS: 15.0000 mg | ORAL_CAPSULE | ORAL | 0 refills | Status: DC

## 2022-01-28 ENCOUNTER — Encounter: Payer: Self-pay | Admitting: Physician Assistant

## 2022-01-28 DIAGNOSIS — E6609 Other obesity due to excess calories: Secondary | ICD-10-CM

## 2022-01-28 LAB — HM MAMMOGRAPHY

## 2022-01-28 MED ORDER — PHENTERMINE HCL 15 MG PO CAPS: 15.0000 mg | ORAL_CAPSULE | ORAL | 0 refills | Status: DC

## 2022-01-28 NOTE — Telephone Encounter (Signed)
Meds ordered this encounter  Medications   phentermine 15 MG capsule    Sig: Take 1 capsule (15 mg total) by mouth every morning.    Dispense:  30 capsule    Refill:  0   I sent 30 days but must keep appt. This medication has to be monitored and hasn't been seen in 6 months.

## 2022-01-29 ENCOUNTER — Telehealth: Payer: Self-pay

## 2022-01-29 NOTE — Telephone Encounter (Signed)
Initiated Prior authorization EUM:[id number] HCl 15MG  capsules Via: Covermymeds Case/Key:[id number] Status: approved  as of 01/29/22 Reason:The authorization is valid from 12/30/2021 through 01/29/2023. A letter of explanation will also be mailed to the patient Notified Pt via: Mychart Called pt

## 2022-02-26 ENCOUNTER — Encounter: Payer: Self-pay | Admitting: Physician Assistant

## 2022-02-26 ENCOUNTER — Ambulatory Visit (INDEPENDENT_AMBULATORY_CARE_PROVIDER_SITE_OTHER): Payer: Medicare Other | Admitting: Physician Assistant

## 2022-02-26 VITALS — BP 127/75 | HR 72 | Ht 67.0 in | Wt 217.0 lb

## 2022-02-26 DIAGNOSIS — Z636 Dependent relative needing care at home: Secondary | ICD-10-CM

## 2022-02-26 DIAGNOSIS — E66811 Obesity, class 1: Secondary | ICD-10-CM

## 2022-02-26 DIAGNOSIS — Z131 Encounter for screening for diabetes mellitus: Secondary | ICD-10-CM | POA: Diagnosis not present

## 2022-02-26 DIAGNOSIS — Z79899 Other long term (current) drug therapy: Secondary | ICD-10-CM | POA: Diagnosis not present

## 2022-02-26 DIAGNOSIS — Z6833 Body mass index (BMI) 33.0-33.9, adult: Secondary | ICD-10-CM

## 2022-02-26 DIAGNOSIS — F419 Anxiety disorder, unspecified: Secondary | ICD-10-CM | POA: Diagnosis not present

## 2022-02-26 DIAGNOSIS — E039 Hypothyroidism, unspecified: Secondary | ICD-10-CM

## 2022-02-26 DIAGNOSIS — E6609 Other obesity due to excess calories: Secondary | ICD-10-CM

## 2022-02-26 DIAGNOSIS — Z6836 Body mass index (BMI) 36.0-36.9, adult: Secondary | ICD-10-CM

## 2022-02-26 DIAGNOSIS — E66812 Obesity, class 2: Secondary | ICD-10-CM

## 2022-02-26 MED ORDER — CLONAZEPAM 0.5 MG PO TABS: 0.5000 mg | ORAL_TABLET | Freq: Two times a day (BID) | ORAL | 5 refills | Status: DC | PRN

## 2022-02-26 MED ORDER — PHENTERMINE HCL 15 MG PO CAPS: 15.0000 mg | ORAL_CAPSULE | ORAL | 0 refills | Status: DC

## 2022-02-26 MED ORDER — TOPIRAMATE 50 MG PO TABS: 50.0000 mg | ORAL_TABLET | Freq: Two times a day (BID) | ORAL | 3 refills | Status: DC

## 2022-02-26 NOTE — Patient Instructions (Signed)
Start b complex

## 2022-02-26 NOTE — Progress Notes (Signed)
Established Patient Office Visit  Subjective   Patient ID: Kristy Jones, female    DOB: May 07, 1955  Age: 67 y.o. MRN: [id number]  Chief Complaint  Patient presents with   Follow-up    HPI Pt is a 67 yo obese female with hypothyroidism, anxiety, depressed mood who presents to the clinic for medication refills.   She is taking care of her dying mother and right now things are really hard. She is having to have help taking care of her so that she can get out of the house. Her oldest son is giving her a lot of trouble as well. Denies any SI/HC. She does not want to start any medication but is using klonapin as needed but not daily.   She has lost 6lbs in 6 months. She is trying to stay active and eat better. She is on topamax and phentermine and doing well. She would like refills.   She is taking her thyroid medications without problems or concerns.     .. 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/14/2014   DDD (degenerative disc disease), lumbar 06/14/2014   Right-sided low back pain without sciatica 06/18/2014   Piriformis syndrome 09/12/2014   Muscle cramps 11/09/2014   Edema of right lower extremity 11/09/2014   Bruising 04/11/2015   Abnormal weight gain 04/11/2015   Eosinophilia 04/13/2015   Bilateral lower extremity edema 07/16/2016   Medial epicondylitis, left 06/07/2017   Seasonal allergies 06/16/2018   Acne vulgaris 06/16/2018   Acute pain of right knee 06/16/2018   Right wrist pain 06/16/2018   Acute stress reaction 06/16/2018   No energy 12/28/2019   Allergic conjunctivitis of both eyes 07/31/2020   Class 1 obesity due to excess calories without serious comorbidity with body mass index (BMI) of 33.0 to 33.9 in adult 11/06/2020   Pain of left calf 07/30/2021   Welcome to Medicare preventive  visit 08/30/2021   Bilateral cataracts 09/04/2021   Tear film insufficiency 09/04/2021   Caregiver stress 02/28/2022   Resolved Ambulatory Problems    Diagnosis Date Noted   HAND PAIN, LEFT 03/08/2009   EUSTACHIAN TUBE DYSFUNCTION, LEFT 08/12/2010   Acute sinusitis, unspecified 08/12/2010   Class 2 obesity due to excess calories without serious comorbidity with body mass index (BMI) of 38.0 to 38.9 in adult 04/11/2015   SOB (shortness of breath) 07/16/2016   Weakness 07/16/2016   Stress at work 02/25/2017   Stress due to illness of family member 08/05/2019   Elevated blood pressure reading 12/28/2019   Past Medical History:  Diagnosis Date   Lichen sclerosus    Thyroid disease     ROS See HPI.    Objective:     BP 127/75 (BP Location: Right Arm, Patient Position: Sitting, Cuff Size: Large)   Pulse 72   Ht 5\' 7"  (1.702 m)   Wt 217 lb 0.6 oz (98.4 kg)   SpO2 97%   BMI 33.99 kg/m  BP Readings from Last 3 Encounters:  02/26/22 127/75  08/30/21 132/64  07/30/21 124/78   Wt Readings from Last 3 Encounters:  02/26/22 217 lb 0.6 oz (98.4 kg)  08/30/21 223 lb (101.2 kg)  07/30/21 230 lb (104.3 kg)    ..    02/26/2022    2:04 PM 07/30/2021    9:39 AM 11/06/2020  11:36 AM 01/30/2020   10:26 AM 08/05/2019    8:49 AM  Depression screen PHQ 2/9  Decreased Interest 1 0 0 0 0  Down, Depressed, Hopeless 1 0 1 0 0  PHQ - 2 Score 2 0 1 0 0  Altered sleeping 1  1 0 0  Tired, decreased energy 1  0 0 1  Change in appetite 2  0 0 1  Feeling bad or failure about yourself  0  0 0 1  Trouble concentrating 2  0 0 0  Moving slowly or fidgety/restless 2  0 0 0  Suicidal thoughts 0  0 0 0  PHQ-9 Score 10  2 0 3  Difficult doing work/chores Somewhat difficult  Not difficult at all Not difficult at all Not difficult at all   ..    02/26/2022    2:04 PM 11/06/2020   11:37 AM 01/30/2020   10:26 AM 08/05/2019    8:47 AM  GAD 7 : Generalized Anxiety Score  Nervous, Anxious, on Edge 2 2 0  3  Control/stop worrying 3 2 2 3   Worry too much - different things 3 2 2  0  Trouble relaxing 3 1 2 3   Restless 3 1 1 3   Easily annoyed or irritable 3 1 0 3  Afraid - awful might happen 3 1 2 3   Total GAD 7 Score 20 10 9 18   Anxiety Difficulty Very difficult Somewhat difficult Not difficult at all Not difficult at all      Physical Exam Constitutional:      Appearance: Normal appearance. She is obese.  HENT:     Head: Normocephalic.  Neck:     Vascular: No carotid bruit.  Cardiovascular:     Rate and Rhythm: Normal rate and regular rhythm.     Pulses: Normal pulses.     Heart sounds: Normal heart sounds.  Pulmonary:     Effort: Pulmonary effort is normal.     Breath sounds: Normal breath sounds.  Musculoskeletal:     Cervical back: Normal range of motion and neck supple. No tenderness.     Right lower leg: No edema.     Left lower leg: No edema.  Lymphadenopathy:     Cervical: No cervical adenopathy.  Neurological:     General: No focal deficit present.     Mental Status: She is alert and oriented to person, place, and time.  Psychiatric:        Mood and Affect: Mood normal.          Assessment & Plan:   Kristy Jones was seen today for follow-up.  Diagnoses and all orders for this visit:  Acquired hypothyroidism -     TSH  Medication management -     TSH -     COMPLETE METABOLIC PANEL WITH GFR -     CBC with Differential/Platelet  Screening for diabetes mellitus -     COMPLETE METABOLIC PANEL WITH GFR  Anxiety -     Discontinue: clonazePAM (KLONOPIN) 0.5 MG tablet; Take 1 tablet (0.5 mg total) by mouth 2 (two) times daily as needed for anxiety. -     clonazePAM (KLONOPIN) 0.5 MG tablet; Take 1 tablet (0.5 mg total) by mouth 2 (two) times daily as needed for anxiety.  Class 2 obesity due to excess calories without serious comorbidity with body mass index (BMI) of 36.0 to 36.9 in adult -     Discontinue: phentermine 15 MG capsule; Take 1 capsule (15 mg total)  by mouth every morning. -     Discontinue: topiramate (TOPAMAX) 50 MG tablet; Take 1 tablet (50 mg total) by mouth 2 (two) times daily. -     phentermine 15 MG capsule; Take 1 capsule (15 mg total) by mouth every morning. -     topiramate (TOPAMAX) 50 MG tablet; Take 1 tablet (50 mg total) by mouth 2 (two) times daily.  Caregiver stress  Class 1 obesity due to excess calories without serious comorbidity with body mass index (BMI) of 33.0 to 33.9 in adult   Vitals look great.  Need to check TSH/CMP/CBC PHQ/GAD numbers are up but due to caregiver stress Discussed ways to manage this  She denies daily medication  She does request klonapin PRN Sent to pharmacy Continue to work on weight loss Doing well Refilled phentermine and topamax  Spent 43 minutes with patient, reviewing chart, and discussing treatment plan.    Return in about 6 months (around 08/27/2022).    Iran Planas, PA-C

## 2022-02-27 ENCOUNTER — Encounter: Payer: Self-pay | Admitting: Physician Assistant

## 2022-02-27 ENCOUNTER — Other Ambulatory Visit: Payer: Self-pay | Admitting: Physician Assistant

## 2022-02-27 DIAGNOSIS — F419 Anxiety disorder, unspecified: Secondary | ICD-10-CM

## 2022-02-27 LAB — CBC WITH DIFFERENTIAL/PLATELET
Absolute Monocytes: 600 cells/uL (ref 200–950)
Basophils Absolute: 28 cells/uL (ref 0–200)
Basophils Relative: 0.5 %
Eosinophils Absolute: 270 cells/uL (ref 15–500)
Eosinophils Relative: 4.9 %
HCT: 39.8 % (ref 35.0–45.0)
Hemoglobin: 13.6 g/dL (ref 11.7–15.5)
Lymphs Abs: 1414 cells/uL (ref 850–3900)
MCH: 30.8 pg (ref 27.0–33.0)
MCHC: 34.2 g/dL (ref 32.0–36.0)
MCV: 90.2 fL (ref 80.0–100.0)
MPV: 11.4 fL (ref 7.5–12.5)
Monocytes Relative: 10.9 %
Neutro Abs: 3190 cells/uL (ref 1500–7800)
Neutrophils Relative %: 58 %
Platelets: 238 10*3/uL (ref 140–400)
RBC: 4.41 10*6/uL (ref 3.80–5.10)
RDW: 11.8 % (ref 11.0–15.0)
Total Lymphocyte: 25.7 %
WBC: 5.5 10*3/uL (ref 3.8–10.8)

## 2022-02-27 LAB — COMPLETE METABOLIC PANEL WITH GFR
AG Ratio: 1.8 (calc) (ref 1.0–2.5)
ALT: 14 U/L (ref 6–29)
AST: 16 U/L (ref 10–35)
Albumin: 4.1 g/dL (ref 3.6–5.1)
Alkaline phosphatase (APISO): 69 U/L (ref 37–153)
BUN: 17 mg/dL (ref 7–25)
CO2: 25 mmol/L (ref 20–32)
Calcium: 9 mg/dL (ref 8.6–10.4)
Chloride: 110 mmol/L (ref 98–110)
Creat: 0.79 mg/dL (ref 0.50–1.05)
Globulin: 2.3 g/dL (calc) (ref 1.9–3.7)
Glucose, Bld: 75 mg/dL (ref 65–99)
Potassium: 3.9 mmol/L (ref 3.5–5.3)
Sodium: 142 mmol/L (ref 135–146)
Total Bilirubin: 0.5 mg/dL (ref 0.2–1.2)
Total Protein: 6.4 g/dL (ref 6.1–8.1)
eGFR: 82 mL/min/{1.73_m2} (ref >=60)

## 2022-02-27 LAB — TSH: TSH: 0.48 mIU/L (ref 0.40–4.50)

## 2022-02-27 NOTE — Telephone Encounter (Signed)
B complex was on AVS.  Did you suggest anything different?

## 2022-02-28 ENCOUNTER — Encounter: Payer: Self-pay | Admitting: Physician Assistant

## 2022-02-28 ENCOUNTER — Other Ambulatory Visit: Payer: Self-pay | Admitting: Physician Assistant

## 2022-02-28 DIAGNOSIS — Z636 Dependent relative needing care at home: Secondary | ICD-10-CM | POA: Insufficient documentation

## 2022-02-28 MED ORDER — LIOTHYRONINE SODIUM 5 MCG PO TABS: 5.0000 ug | ORAL_TABLET | Freq: Every day | ORAL | 1 refills | Status: DC

## 2022-02-28 MED ORDER — TOPIRAMATE 50 MG PO TABS: 50.0000 mg | ORAL_TABLET | Freq: Two times a day (BID) | ORAL | 3 refills | Status: DC

## 2022-02-28 MED ORDER — PHENTERMINE HCL 15 MG PO CAPS: 15.0000 mg | ORAL_CAPSULE | ORAL | 0 refills | Status: DC

## 2022-02-28 MED ORDER — LEVOTHYROXINE SODIUM 112 MCG PO TABS: 112.0000 ug | ORAL_TABLET | Freq: Every day | ORAL | 1 refills | Status: DC

## 2022-02-28 MED ORDER — CLONAZEPAM 0.5 MG PO TABS
0.5000 mg | ORAL_TABLET | Freq: Two times a day (BID) | ORAL | 5 refills | Status: DC | PRN
Start: 2022-02-28 — End: 2022-08-27

## 2022-02-28 NOTE — Addendum Note (Signed)
Addended byAnnamaria Helling on: 02/28/2022 04:02 PM   Modules accepted: Orders

## 2022-02-28 NOTE — Progress Notes (Signed)
Suzana,   Thyroid looks better than 1 year ago and now in normal range.  Kidney, liver, glucose looks great.  WBC/hemoglobin looks good.

## 2022-03-24 ENCOUNTER — Other Ambulatory Visit: Payer: Self-pay | Admitting: Neurology

## 2022-03-24 DIAGNOSIS — M25561 Pain in right knee: Secondary | ICD-10-CM

## 2022-03-24 MED ORDER — MELOXICAM 15 MG PO TABS: 15.0000 mg | ORAL_TABLET | Freq: Every day | ORAL | 0 refills | Status: DC

## 2022-05-27 ENCOUNTER — Other Ambulatory Visit: Payer: Self-pay | Admitting: Physician Assistant

## 2022-05-27 DIAGNOSIS — E6609 Other obesity due to excess calories: Secondary | ICD-10-CM

## 2022-06-05 ENCOUNTER — Ambulatory Visit (INDEPENDENT_AMBULATORY_CARE_PROVIDER_SITE_OTHER): Payer: Medicare Other | Admitting: Family Medicine

## 2022-06-05 ENCOUNTER — Encounter: Payer: Self-pay | Admitting: Family Medicine

## 2022-06-05 VITALS — BP 123/77 | HR 71 | Temp 97.7°F | Ht 67.0 in | Wt 223.1 lb

## 2022-06-05 DIAGNOSIS — M25572 Pain in left ankle and joints of left foot: Secondary | ICD-10-CM

## 2022-06-05 MED ORDER — CEPHALEXIN 500 MG PO CAPS: 500.0000 mg | ORAL_CAPSULE | Freq: Two times a day (BID) | ORAL | 0 refills | Status: AC

## 2022-06-05 MED ORDER — FLUCONAZOLE 150 MG PO TABS: 150.0000 mg | ORAL_TABLET | Freq: Once | ORAL | 0 refills | Status: AC

## 2022-06-05 NOTE — Assessment & Plan Note (Addendum)
-   likely bruising and swelling noted from spraining her ankle.  - since she is traveling this weekend and worried about a possible infection I have given her keflex to take with her and use only if erythema and tenderness occur. Diflucan given for yeast infection. - recommended ace bandage and supportive shoes

## 2022-06-05 NOTE — Progress Notes (Signed)
   Acute Office Visit  Subjective:     Patient ID: Kristy Jones, female    DOB: January 30, 1956, 67 y.o.   MRN: [id number]  Chief Complaint  Patient presents with   Ankle Pain    Left (inner) ankle, some swelling, but has decreased. She is going out of town this weekend and does not want pain to increase.     HPI Patient is in today for acute left ankle pain. She has a hx of cellulitis and phlebitis. She denies any biting in the area. She is able to bear weight. She says it has gotten hot and swollen.   Review of Systems  Constitutional:  Negative for chills and fever.  Respiratory:  Negative for cough and shortness of breath.   Cardiovascular:  Negative for chest pain.  Neurological:  Negative for headaches.        Objective:    BP 123/77   Pulse 71   Temp 97.7 F (36.5 C) (Oral)   Ht 5\' 7"  (1.702 m)   Wt 223 lb 1.9 oz (101.2 kg)   SpO2 99%   BMI 34.95 kg/m    Physical Exam Vitals and nursing note reviewed.  Constitutional:      General: She is not in acute distress.    Appearance: Normal appearance.  HENT:     Head: Normocephalic and atraumatic.     Right Ear: External ear normal.     Left Ear: External ear normal.     Nose: Nose normal.  Eyes:     Conjunctiva/sclera: Conjunctivae normal.  Cardiovascular:     Rate and Rhythm: Normal rate and regular rhythm.  Pulmonary:     Effort: Pulmonary effort is normal.     Breath sounds: Normal breath sounds.  Musculoskeletal:     Comments: Left ankle tenderness to palpation some bruising and swelling.   Neurological:     General: No focal deficit present.     Mental Status: She is alert and oriented to person, place, and time.  Psychiatric:        Mood and Affect: Mood normal.        Behavior: Behavior normal.        Thought Content: Thought content normal.        Judgment: Judgment normal.     No results found for any visits on 06/05/22.      Assessment & Plan:   Problem List Items Addressed This Visit        Other   Acute left ankle pain - Primary    - likely bruising and swelling noted from spraining her ankle.  - since she is traveling this weekend and worried about a possible infection I have given her keflex to take with her and use only if erythema and tenderness occur. Diflucan given for yeast infection. - recommended ace bandage and supportive shoes       Meds ordered this encounter  Medications   cephALEXin (KEFLEX) 500 MG capsule    Sig: Take 1 capsule (500 mg total) by mouth 2 (two) times daily for 5 days.    Dispense:  10 capsule    Refill:  0   fluconazole (DIFLUCAN) 150 MG tablet    Sig: Take 1 tablet (150 mg total) by mouth once for 1 dose.    Dispense:  2 tablet    Refill:  0    Return if symptoms worsen or fail to improve.  Charlton Amor, DO

## 2022-06-22 ENCOUNTER — Other Ambulatory Visit: Payer: Self-pay | Admitting: Physician Assistant

## 2022-06-22 DIAGNOSIS — M25561 Pain in right knee: Secondary | ICD-10-CM

## 2022-06-25 ENCOUNTER — Encounter: Payer: Self-pay | Admitting: Physician Assistant

## 2022-08-27 ENCOUNTER — Encounter: Payer: Self-pay | Admitting: Physician Assistant

## 2022-08-27 ENCOUNTER — Ambulatory Visit (INDEPENDENT_AMBULATORY_CARE_PROVIDER_SITE_OTHER): Payer: Medicare Other | Admitting: Physician Assistant

## 2022-08-27 VITALS — BP 120/59 | HR 79 | Ht 67.0 in | Wt 236.0 lb

## 2022-08-27 DIAGNOSIS — R635 Abnormal weight gain: Secondary | ICD-10-CM

## 2022-08-27 DIAGNOSIS — F419 Anxiety disorder, unspecified: Secondary | ICD-10-CM

## 2022-08-27 DIAGNOSIS — E6609 Other obesity due to excess calories: Secondary | ICD-10-CM

## 2022-08-27 DIAGNOSIS — E039 Hypothyroidism, unspecified: Secondary | ICD-10-CM

## 2022-08-27 MED ORDER — CLONAZEPAM 0.5 MG PO TABS
0.5000 mg | ORAL_TABLET | Freq: Two times a day (BID) | ORAL | 5 refills | Status: DC | PRN
Start: 2022-08-27 — End: 2023-02-03

## 2022-08-27 NOTE — Progress Notes (Signed)
Established Patient Office Visit  Subjective   Patient ID: Kristy Jones, female    DOB: 1955/11/16  Age: 67 y.o. MRN: [id number]  Chief Complaint  Patient presents with   Follow-up    HPI Pt is a 67 yo obese female who presents to the clinic for medication refills.   She was on phentermine/topamax for weight and feels like not helping any more. She has stopped both. She continues to eat healthy and cook health and stay active but not losing weight and even gaining weight. She is frustrated. She has hx of hypothyroidism and would like labs checked today.   Anxiety and mood is pretty well controlled. She does continue to take klonapin fairly regularly. No SI/HC.  ... 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/14/2014   DDD (degenerative disc disease), lumbar 06/14/2014   Right-sided low back pain without sciatica 06/18/2014   Piriformis syndrome 09/12/2014   Muscle cramps 11/09/2014   Edema of right lower extremity 11/09/2014   Bruising 04/11/2015   Abnormal weight gain 04/11/2015   Eosinophilia 04/13/2015   Bilateral lower extremity edema 07/16/2016   Medial epicondylitis, left 06/07/2017   Seasonal allergies 06/16/2018   Acne vulgaris 06/16/2018   Acute pain of right knee 06/16/2018   Right wrist pain 06/16/2018   Acute stress reaction 06/16/2018   No energy 12/28/2019   Allergic conjunctivitis of both eyes 07/31/2020   Class 2 obesity due to excess calories without serious comorbidity with body mass index (BMI) of 36.0 to 36.9 in adult 11/06/2020   Pain of left calf 07/30/2021   Welcome to Medicare preventive visit 08/30/2021   Bilateral cataracts 09/04/2021   Tear film insufficiency 09/04/2021   Caregiver stress 02/28/2022   Acute left ankle pain 06/05/2022   Resolved Ambulatory Problems     Diagnosis Date Noted   HAND PAIN, LEFT 03/08/2009   EUSTACHIAN TUBE DYSFUNCTION, LEFT 08/12/2010   Acute sinusitis, unspecified 08/12/2010   Class 2 obesity due to excess calories without serious comorbidity with body mass index (BMI) of 38.0 to 38.9 in adult 04/11/2015   SOB (shortness of breath) 07/16/2016   Weakness 07/16/2016   Stress at work 02/25/2017   Stress due to illness of family member 08/05/2019   Elevated blood pressure reading 12/28/2019   Past Medical History:  Diagnosis Date   Lichen sclerosus    Thyroid disease      ROS See HPI.    Objective:     BP (!) 120/59 (BP Location: Left Arm, Patient Position: Sitting, Cuff Size: Normal)   Pulse 79   Ht 5\' 7"  (1.702 m)   Wt 236 lb (107 kg)   SpO2 96%   BMI 36.96 kg/m  BP Readings from Last 3 Encounters:  08/27/22 (!) 120/59  06/05/22 123/77  02/26/22 127/75   Wt Readings from Last 3 Encounters:  08/27/22 236 lb (107 kg)  06/05/22 223 lb 1.9 oz (101.2 kg)  02/26/22 217 lb 0.6 oz (98.4 kg)    ..    02/26/2022    2:04 PM 07/30/2021    9:39 AM 11/06/2020   11:36 AM 01/30/2020   10:26 AM 08/05/2019    8:49 AM  Depression screen PHQ 2/9  Decreased Interest 1 0 0 0 0  Down, Depressed, Hopeless 1 0 1 0 0  PHQ -  2 Score 2 0 1 0 0  Altered sleeping 1  1 0 0  Tired, decreased energy 1  0 0 1  Change in appetite 2  0 0 1  Feeling bad or failure about yourself  0  0 0 1  Trouble concentrating 2  0 0 0  Moving slowly or fidgety/restless 2  0 0 0  Suicidal thoughts 0  0 0 0  PHQ-9 Score 10  2 0 3  Difficult doing work/chores Somewhat difficult  Not difficult at all Not difficult at all Not difficult at all   ..    02/26/2022    2:04 PM 11/06/2020   11:37 AM 01/30/2020   10:26 AM 08/05/2019    8:47 AM  GAD 7 : Generalized Anxiety Score  Nervous, Anxious, on Edge 2 2 0 3  Control/stop worrying 3 2 2 3   Worry too much - different things 3 2 2  0  Trouble relaxing 3 1 2 3   Restless 3 1 1 3   Easily annoyed or  irritable 3 1 0 3  Afraid - awful might happen 3 1 2 3   Total GAD 7 Score 20 10 9 18   Anxiety Difficulty Very difficult Somewhat difficult Not difficult at all Not difficult at all      Physical Exam Constitutional:      Appearance: Normal appearance. She is obese.  HENT:     Head: Normocephalic.  Neck:     Vascular: No carotid bruit.  Cardiovascular:     Rate and Rhythm: Normal rate and regular rhythm.     Pulses: Normal pulses.  Pulmonary:     Effort: Pulmonary effort is normal.     Breath sounds: Normal breath sounds.  Musculoskeletal:     Cervical back: Normal range of motion and neck supple. No rigidity or tenderness.     Right lower leg: No edema.     Left lower leg: No edema.  Lymphadenopathy:     Cervical: No cervical adenopathy.  Neurological:     General: No focal deficit present.     Mental Status: She is alert and oriented to person, place, and time.  Psychiatric:        Mood and Affect: Mood normal.       The 10-year ASCVD risk score (Arnett DK, et al., 2019) is: 9%    Assessment & Plan:  Marland KitchenMarland KitchenMarca was seen today for follow-up.  Diagnoses and all orders for this visit:  Abnormal weight gain -     TSH + free T4 -     T3, free -     COMPLETE METABOLIC PANEL WITH GFR  Anxiety -     clonazePAM (KLONOPIN) 0.5 MG tablet; Take 1 tablet (0.5 mg total) by mouth 2 (two) times daily as needed for anxiety. -     COMPLETE METABOLIC PANEL WITH GFR  Acquired hypothyroidism -     TSH + free T4 -     T3, free -     COMPLETE METABOLIC PANEL WITH GFR  Class 2 obesity due to excess calories without serious comorbidity with body mass index (BMI) of 36.0 to 36.9 in adult   Vitals look great Will check TSH and cmp and see if any changes need to be made .Marland KitchenDiscussed low carb diet with 1500 calories and 80g of protein.  Exercising at least 150 minutes a week.  [employer] could be a Chief Technology Officer.  Discussed IF 16 to 8, HO given.  Marland KitchenMarland KitchenPDMP reviewed  during this  encounter. No concerns klonapin refilled Discussed needing a daily medication for anxiety, pt declines today.  Follow up in 6 months  Return in about 6 months (around 02/27/2023).    Tandy Gaw, PA-C

## 2022-08-28 LAB — COMPLETE METABOLIC PANEL WITH GFR
AG Ratio: 1.7 (calc) (ref 1.0–2.5)
ALT: 12 U/L (ref 6–29)
AST: 14 U/L (ref 10–35)
Albumin: 4.1 g/dL (ref 3.6–5.1)
Alkaline phosphatase (APISO): 72 U/L (ref 37–153)
BUN: 21 mg/dL (ref 7–25)
CO2: 28 mmol/L (ref 20–32)
Calcium: 9.7 mg/dL (ref 8.6–10.4)
Chloride: 109 mmol/L (ref 98–110)
Creat: 0.79 mg/dL (ref 0.50–1.05)
Globulin: 2.4 g/dL (calc) (ref 1.9–3.7)
Glucose, Bld: 99 mg/dL (ref 65–99)
Potassium: 4.5 mmol/L (ref 3.5–5.3)
Sodium: 142 mmol/L (ref 135–146)
Total Bilirubin: 0.5 mg/dL (ref 0.2–1.2)
Total Protein: 6.5 g/dL (ref 6.1–8.1)
eGFR: 82 mL/min/{1.73_m2} (ref >=60)

## 2022-08-28 LAB — T3, FREE: T3, Free: 2.9 pg/mL (ref 2.3–4.2)

## 2022-08-28 LAB — TSH+FREE T4: TSH W/REFLEX TO FT4: 0.53 mIU/L (ref 0.40–4.50)

## 2022-08-29 NOTE — Progress Notes (Signed)
Thyroid looks really good.  Labs look great.

## 2022-09-02 ENCOUNTER — Encounter: Payer: Self-pay | Admitting: Physician Assistant

## 2022-11-07 ENCOUNTER — Ambulatory Visit (INDEPENDENT_AMBULATORY_CARE_PROVIDER_SITE_OTHER): Payer: Medicare Other | Admitting: Family Medicine

## 2022-11-07 ENCOUNTER — Encounter: Payer: Self-pay | Admitting: Family Medicine

## 2022-11-07 VITALS — BP 131/69 | HR 76 | Temp 98.2°F | Resp 18 | Ht 67.0 in | Wt 247.3 lb

## 2022-11-07 DIAGNOSIS — R0609 Other forms of dyspnea: Secondary | ICD-10-CM

## 2022-11-07 DIAGNOSIS — F321 Major depressive disorder, single episode, moderate: Secondary | ICD-10-CM | POA: Diagnosis not present

## 2022-11-07 DIAGNOSIS — R600 Localized edema: Secondary | ICD-10-CM | POA: Diagnosis not present

## 2022-11-07 MED ORDER — HYDROCHLOROTHIAZIDE 12.5 MG PO CAPS
12.5000 mg | ORAL_CAPSULE | Freq: Every day | ORAL | 0 refills | Status: DC | PRN
Start: 2022-11-07 — End: 2023-02-27

## 2022-11-07 NOTE — Assessment & Plan Note (Addendum)
Lower extremity swelling worse in the heat.  Recently returned from beach. Mostly resolved today. Recommend mild compression hose, limiting salt intake, elevating legs. Hydrochlorothiazide 12.5 mg daily as needed # 10 until she can follow-up with her PCP. Last potassium 4.5 in July.

## 2022-11-07 NOTE — Patient Instructions (Addendum)
I placed a referral to psychiatry to address your depression. You would benefit from going on medication for depression.  Get mild compression hose that fit you well, elevate your legs, and watch your salt intake. Take hydrochlorothiazide 12.5 mg daily as needed for swelling until you can see PCP in one week.

## 2022-11-07 NOTE — Assessment & Plan Note (Signed)
Reports intermittent dyspnea with exertion. Lungs clear, oxygen saturation 95 % no obvious shortness of breath during visit. Follow-up with PCP.

## 2022-11-07 NOTE — Progress Notes (Signed)
Established Patient Office Visit  Subjective   Patient ID: Kristy Jones, female    DOB: 1955/06/18  Age: 67 y.o. MRN: [id number]  Chief Complaint  Patient presents with   Depression   Leg Swelling    Patient state that after returning from vacation that her legs were swollen she states that she wore compression stockings and it helped with the swelling   Shortness of Breath    HPI  Depression: takes clonazepam 0.5 mg some days, took this morning. Would benefit from maintenance therapy for depression. Struggling with the death of mother and there is stress with her sons. Denies thoughts of suicide.   Intermittent lower extremity swelling: worse in the heat, recently returned from beach. Wearing compression hose which helped. Not currently swollen in the office today. Does not think she ate more sodium while at the beach.   Dyspnea with exertion: when walking her dog and when she gets up in the morning. Able to converse with provider. Has gained weight.    Review of Systems  Respiratory:  Positive for shortness of breath (with exertion.).   Cardiovascular:  Positive for leg swelling (not currently in office). Negative for chest pain.      Objective:     BP 131/69   Pulse 76   Temp 98.2 F (36.8 C) (Oral)   Resp 18   Ht 5\' 7"  (1.702 m)   Wt 247 lb 4.8 oz (112.2 kg)   SpO2 95%   BMI 38.73 kg/m  BP Readings from Last 3 Encounters:  11/07/22 131/69  08/27/22 (!) 120/59  06/05/22 123/77      Physical Exam Vitals and nursing note reviewed.  Constitutional:      General: She is not in acute distress.    Appearance: She is well-developed. She is obese.  Cardiovascular:     Rate and Rhythm: Normal rate and regular rhythm.     Heart sounds: Normal heart sounds.  Pulmonary:     Effort: Pulmonary effort is normal.     Breath sounds: Normal breath sounds.  Musculoskeletal:     Right lower leg: Edema present.     Left lower leg: Edema (non pitting: mostly resolved.)  present.  Skin:    General: Skin is warm and dry.  Neurological:     General: No focal deficit present.     Mental Status: She is alert. Mental status is at baseline.  Psychiatric:        Behavior: Behavior normal.        Thought Content: Thought content normal.        Judgment: Judgment normal.     Comments: Tearful in office.      No results found for any visits on 11/07/22.  Flowsheet Row Office Visit from 11/07/2022 in [facility]  PHQ-9 Total Score 16     Denies thoughts of self-harm    The 10-year ASCVD risk score (Arnett DK, et al., 2019) is: 6.7%    Assessment & Plan:   Problem List Items Addressed This Visit     Edema of right lower extremity    Lower extremity swelling worse in the heat.  Recently returned from beach. Mostly resolved today. Recommend mild compression hose, limiting salt intake, elevating legs. Hydrochlorothiazide 12.5 mg daily as needed # 10 until she can follow-up with her PCP. Last potassium 4.5 in July.       Dyspnea on exertion    Reports intermittent dyspnea with exertion.  Lungs clear, oxygen saturation 95 % no obvious shortness of breath during visit. Follow-up with PCP.       Bilateral lower extremity edema   Relevant Medications   hydrochlorothiazide (MICROZIDE) 12.5 MG capsule   Depression, major, single episode, moderate (HCC) - Primary       11/07/2022   11:18 AM 02/26/2022    2:04 PM 07/30/2021    9:39 AM 11/06/2020   11:36 AM 01/30/2020   10:26 AM  Depression screen PHQ 2/9  Decreased Interest 2 1 0 0 0  Down, Depressed, Hopeless 3 1 0 1 0  PHQ - 2 Score 5 2 0 1 0  Altered sleeping 3 1  1  0  Tired, decreased energy 3 1  0 0  Change in appetite 2 2  0 0  Feeling bad or failure about yourself  3 0  0 0  Trouble concentrating 0 2  0 0  Moving slowly or fidgety/restless 0 2  0 0  Suicidal thoughts 0 0  0 0  PHQ-9 Score 16 10  2  0  Difficult doing work/chores  Somewhat difficult  Not difficult at  all Not difficult at all  Referral placed for psychiatry for further evaluation. Would benefit from maintenance medication.       Relevant Orders   Ambulatory referral to Psychiatry  Agrees with plan of care discussed.  Questions answered.   Return for call PCP to get on schedule in next week for follow-up .    Novella Olive, FNP

## 2022-11-07 NOTE — Assessment & Plan Note (Signed)
    11/07/2022   11:18 AM 02/26/2022    2:04 PM 07/30/2021    9:39 AM 11/06/2020   11:36 AM 01/30/2020   10:26 AM  Depression screen PHQ 2/9  Decreased Interest 2 1 0 0 0  Down, Depressed, Hopeless 3 1 0 1 0  PHQ - 2 Score 5 2 0 1 0  Altered sleeping 3 1  1  0  Tired, decreased energy 3 1  0 0  Change in appetite 2 2  0 0  Feeling bad or failure about yourself  3 0  0 0  Trouble concentrating 0 2  0 0  Moving slowly or fidgety/restless 0 2  0 0  Suicidal thoughts 0 0  0 0  PHQ-9 Score 16 10  2  0  Difficult doing work/chores  Somewhat difficult  Not difficult at all Not difficult at all  Referral placed for psychiatry for further evaluation. Would benefit from maintenance medication.

## 2022-11-10 ENCOUNTER — Ambulatory Visit (INDEPENDENT_AMBULATORY_CARE_PROVIDER_SITE_OTHER): Payer: Medicare Other | Admitting: Physician Assistant

## 2022-11-10 ENCOUNTER — Telehealth: Payer: Self-pay | Admitting: Physician Assistant

## 2022-11-10 ENCOUNTER — Encounter: Payer: Self-pay | Admitting: Physician Assistant

## 2022-11-10 VITALS — BP 136/84 | HR 76 | Ht 67.0 in | Wt 248.5 lb

## 2022-11-10 DIAGNOSIS — R0602 Shortness of breath: Secondary | ICD-10-CM

## 2022-11-10 DIAGNOSIS — F331 Major depressive disorder, recurrent, moderate: Secondary | ICD-10-CM | POA: Insufficient documentation

## 2022-11-10 DIAGNOSIS — F4321 Adjustment disorder with depressed mood: Secondary | ICD-10-CM | POA: Diagnosis not present

## 2022-11-10 DIAGNOSIS — R0789 Other chest pain: Secondary | ICD-10-CM | POA: Diagnosis not present

## 2022-11-10 DIAGNOSIS — F321 Major depressive disorder, single episode, moderate: Secondary | ICD-10-CM

## 2022-11-10 DIAGNOSIS — R600 Localized edema: Secondary | ICD-10-CM

## 2022-11-10 NOTE — Telephone Encounter (Signed)
Patient wanted to know if it was okay to schedule appointment with Dr. Karie Schwalbe for her hip pain. States that it was mentioned in her appointment but was unsure.

## 2022-11-10 NOTE — Patient Instructions (Signed)
Will get echo

## 2022-11-10 NOTE — Progress Notes (Signed)
Established Patient Office Visit  Subjective   Patient ID: Kristy Jones, female    DOB: [birth year]  Age: 67 y.o. MRN: [id number]  Chief Complaint  Patient presents with   Depression    Patient states she has upcoming appt schdl with therapist - virtual visit on Monday 09/17/22.   Leg Swelling    Patient c/o blateral feet / leg swelling  fatigue, shortness of breath, bilateral leg weakness x a couple of weeks     HPI Pt is a 67 yo obese female who presents to the clinic to discuss mood. She is very depressed. She does not have any time to do and feels alone. Her mom died in 04/20/2022. Her sons don't have a lot to do with her. She did go to the beach the other week with a friend and had a good time. When she came back home all her emotions hit her. She uses klonapin as needed. She has appt with mood treatment center Monday.   Her lower extremity edema is better with HcTZ and compression stockings. She is having chest tightness, SOB with exertion, and leg weakness. She is worried about her heart. She feels like she cannot do the things she used to do.   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/14/2014   DDD (degenerative disc disease), lumbar 06/14/2014   Right-sided low back pain without sciatica 06/18/2014   Piriformis syndrome 09/12/2014   Muscle cramps 11/09/2014   Edema of right lower extremity 11/09/2014   Bruising 04/11/2015   Abnormal weight gain 04/11/2015   Eosinophilia 2015/04/21   SOB (shortness of breath) on exertion 07/16/2016   Bilateral lower extremity edema 07/16/2016   Medial epicondylitis, left 06/07/2017   Seasonal allergies 06/16/2018   Acne vulgaris 06/16/2018   Acute pain of right knee 06/16/2018   Right wrist pain 06/16/2018   Acute stress reaction 06/16/2018   No energy 12/28/2019    Allergic conjunctivitis of both eyes 07/31/2020   Class 2 obesity due to excess calories without serious comorbidity with body mass index (BMI) of 36.0 to 36.9 in adult 11/06/2020   Pain of left calf 07/30/2021   Welcome to Medicare preventive visit 08/30/2021   Bilateral cataracts 09/04/2021   Tear film insufficiency 09/04/2021   Caregiver stress 02/28/2022   Acute left ankle pain 06/05/2022   Depression, major, single episode, moderate (HCC) 11/07/2022   Chest tightness 11/10/2022   Grief 11/10/2022   Moderate episode of recurrent major depressive disorder (HCC) 11/10/2022   Resolved Ambulatory Problems    Diagnosis Date Noted   HAND PAIN, LEFT 03/08/2009   EUSTACHIAN TUBE DYSFUNCTION, LEFT 08/12/2010   Acute sinusitis, unspecified 08/12/2010   Class 2 obesity due to excess calories without serious comorbidity with body mass index (BMI) of 38.0 to 38.9 in adult 04/11/2015   Weakness 07/16/2016   Stress at work 02/25/2017   Stress due to illness of family member 08/05/2019   Elevated blood pressure reading 12/28/2019   Past Medical History:  Diagnosis Date   Lichen sclerosus    Thyroid disease      ROS See HPI.    Objective:     BP 136/84   Pulse 76   Ht 5\' 7"  (1.702 m)   Wt 248 lb 8 oz (112.7 kg)   SpO2 95%   BMI  38.92 kg/m  BP Readings from Last 3 Encounters:  11/10/22 136/84  11/07/22 131/69  08/27/22 (!) 120/59   Wt Readings from Last 3 Encounters:  11/10/22 248 lb 8 oz (112.7 kg)  11/07/22 247 lb 4.8 oz (112.2 kg)  08/27/22 236 lb (107 kg)    ..    11/10/2022    8:52 AM 11/07/2022   11:18 AM 02/26/2022    2:04 PM 07/30/2021    9:39 AM 11/06/2020   11:36 AM  Depression screen PHQ 2/9  Decreased Interest 3 2 1  0 0  Down, Depressed, Hopeless 3 3 1  0 1  PHQ - 2 Score 6 5 2  0 1  Altered sleeping 3 3 1  1   Tired, decreased energy 3 3 1   0  Change in appetite 3 2 2   0  Feeling bad or failure about yourself  3 3 0  0  Trouble concentrating 0 0 2  0   Moving slowly or fidgety/restless 0 0 2  0  Suicidal thoughts 0 0 0  0  PHQ-9 Score 18 16 10  2   Difficult doing work/chores Very difficult  Somewhat difficult  Not difficult at all   ..    11/10/2022    8:52 AM 02/26/2022    2:04 PM 11/06/2020   11:37 AM 01/30/2020   10:26 AM  GAD 7 : Generalized Anxiety Score  Nervous, Anxious, on Edge 2 2 2  0  Control/stop worrying 3 3 2 2   Worry too much - different things 3 3 2 2   Trouble relaxing 2 3 1 2   Restless 0 3 1 1   Easily annoyed or irritable 2 3 1  0  Afraid - awful might happen 3 3 1 2   Total GAD 7 Score 15 20 10 9   Anxiety Difficulty Very difficult Very difficult Somewhat difficult Not difficult at all      Physical Exam Constitutional:      Appearance: Normal appearance. She is obese.  HENT:     Head: Normocephalic.  Neck:     Vascular: No carotid bruit.  Cardiovascular:     Rate and Rhythm: Normal rate and regular rhythm.     Pulses: Normal pulses.     Heart sounds: Normal heart sounds.  Pulmonary:     Effort: Pulmonary effort is normal.     Breath sounds: Normal breath sounds.  Musculoskeletal:     Cervical back: Normal range of motion and neck supple.     Right lower leg: No edema.     Left lower leg: No edema.     Comments: Evidence of venous stasis, bilaterally.   Neurological:     General: No focal deficit present.     Mental Status: She is alert and oriented to person, place, and time.  Psychiatric:     Comments: tearful      The 10-year ASCVD risk score (Arnett DK, et al., 2019) is: 7.2%    Assessment & Plan:  Marland KitchenMarland KitchenAeris was seen today for depression and leg swelling.  Diagnoses and all orders for this visit:  SOB (shortness of breath) on exertion -     ECHOCARDIOGRAM COMPLETE; Future -     Cardiac Stress Test: Informed Consent Details: Physician/Practitioner Attestation; Transcribe to consent form and obtain patient signature -     EKG 12-Lead  Chest tightness -     ECHOCARDIOGRAM COMPLETE;  Future -     Cardiac Stress Test: Informed Consent Details: Physician/Practitioner Attestation; Transcribe to consent form and  obtain patient signature  Grief  Depression, major, single episode, moderate (HCC)  Bilateral lower extremity edema -     ECHOCARDIOGRAM COMPLETE; Future -     Cardiac Stress Test: Informed Consent Details: Physician/Practitioner Attestation; Transcribe to consent form and obtain patient signature   Pt has mood treatment appt Monday will hold on starting medication Encouraged counseling to help deal with grief and life changes Klonapin only as needed Encouraged patient to really think about a hobby Legs less swollen today-use compression stockings and hydrochlorothiazide as needed Will get echo  EKG no acute changes, no ST elevation or depression Follow up after EKG and Echo.    Tandy Gaw, PA-C

## 2022-11-13 ENCOUNTER — Ambulatory Visit: Payer: Medicare Other

## 2022-11-13 ENCOUNTER — Ambulatory Visit (INDEPENDENT_AMBULATORY_CARE_PROVIDER_SITE_OTHER): Payer: Medicare Other | Admitting: Sports Medicine

## 2022-11-13 ENCOUNTER — Encounter: Payer: Self-pay | Admitting: Physician Assistant

## 2022-11-13 DIAGNOSIS — M47816 Spondylosis without myelopathy or radiculopathy, lumbar region: Secondary | ICD-10-CM

## 2022-11-13 MED ORDER — ACETAMINOPHEN ER 650 MG PO TBCR: 650.0000 mg | EXTENDED_RELEASE_TABLET | Freq: Three times a day (TID) | ORAL | Status: DC | PRN

## 2022-11-13 MED ORDER — MELOXICAM 15 MG PO TABS
ORAL_TABLET | ORAL | 3 refills | Status: DC
Start: 2022-11-13 — End: 2023-03-15

## 2022-11-13 NOTE — Assessment & Plan Note (Signed)
Pleasant 67 year old female, long history of axial low back pain, she has been seen by Dr. Denyse Amass in the past and got some SI joint injections which seem to help. More recently she is having increasing axial low back pain, she does report numbness and tingling going down the right foot, to the outer 3 toes. Pain is better with flexion of her hip and spine. She does get cramping in her hips and thighs with walking consistent with neurogenic claudication, nothing in the calves. No red flag symptoms. On exam she does have tenderness throughout the lumbar spine and SI joints. I did inform her of the potential diagnoses including SI joint dysfunction, lumbar spinal stenosis. She infection did have x-rays done in 2016 that showed L4-S1 lumbar DDD. We discussed the anatomy and evolutionary anthropology of lumbar disc disease, we will start with physical therapy, she prefers [facility], meloxicam, Tylenol, I would like x-rays of her back, return to see me in 6 weeks, will consider MRI if no better.

## 2022-11-13 NOTE — Progress Notes (Signed)
    Procedures performed today:    None.  Independent interpretation of notes and tests performed by another provider:   None.  Brief History, Exam, Impression, and Recommendations:    Spondylosis of lumbar spine Kristy Jones 67 year old female, long history of axial low back pain, she has been seen by Dr. Denyse Amass in the past and got some SI joint injections which seem to help. More recently she is having increasing axial low back pain, she does report numbness and tingling going down the right foot, to the outer 3 toes. Pain is better with flexion of her hip and spine. She does get cramping in her hips and thighs with walking consistent with neurogenic claudication, nothing in the calves. No red flag symptoms. On exam she does have tenderness throughout the lumbar spine and SI joints. I did inform her of the potential diagnoses including SI joint dysfunction, lumbar spinal stenosis. She infection did have x-rays done in 2016 that showed L4-S1 lumbar DDD. We discussed the anatomy and evolutionary anthropology of lumbar disc disease, we will start with physical therapy, she prefers [facility], meloxicam, Tylenol, I would like x-rays of her back, return to see me in 6 weeks, will consider MRI if no better.    ____________________________________________ Ihor Austin. Benjamin Stain, M.D., ABFM., CAQSM., AME. Primary Care and Sports Medicine [facility]  Adjunct Professor of Family Medicine  [employer]  Restaurant manager, fast food

## 2022-11-14 NOTE — Telephone Encounter (Signed)
Can you check on this for patient.

## 2022-11-19 ENCOUNTER — Encounter: Payer: Self-pay | Admitting: Sports Medicine

## 2022-12-25 ENCOUNTER — Encounter: Payer: Self-pay | Admitting: Sports Medicine

## 2022-12-25 ENCOUNTER — Ambulatory Visit (INDEPENDENT_AMBULATORY_CARE_PROVIDER_SITE_OTHER): Payer: Medicare Other | Admitting: Sports Medicine

## 2022-12-25 DIAGNOSIS — M47816 Spondylosis without myelopathy or radiculopathy, lumbar region: Secondary | ICD-10-CM

## 2022-12-25 NOTE — Progress Notes (Signed)
    Procedures performed today:    None.  Independent interpretation of notes and tests performed by another provider:   None.  Brief History, Exam, Impression, and Recommendations:    Spondylosis of lumbar spine This pleasant 67 year old female returns, she has a long history of axial low back pain, she did see Dr. Denyse Amass in the past and had some SI joint injections which seemed to help, more recently she was having increasing axial low back pain, numbness and tingling down the right foot to the outer 3 toes, better with flexion, she did get cramping in her hips and thighs consistent with neurogenic claudication, nothing in the cast for We started with conservative treatment including lumbar spine and SI joint x-rays, formal PT, she is doing dramatically better. She did have a fall but is improving from this. She will continue with her weight loss journey, formal and home conditioning, and she can return to see me on an as-needed basis.    ____________________________________________ Ihor Austin. Benjamin Stain, M.D., ABFM., CAQSM., AME. Primary Care and Sports Medicine [facility]  Adjunct Professor of Family Medicine  [employer]  Restaurant manager, fast food

## 2022-12-25 NOTE — Assessment & Plan Note (Signed)
This pleasant 67 year old female returns, she has a long history of axial low back pain, she did see Dr. Denyse Amass in the past and had some SI joint injections which seemed to help, more recently she was having increasing axial low back pain, numbness and tingling down the right foot to the outer 3 toes, better with flexion, she did get cramping in her hips and thighs consistent with neurogenic claudication, nothing in the cast for We started with conservative treatment including lumbar spine and SI joint x-rays, formal PT, she is doing dramatically better. She did have a fall but is improving from this. She will continue with her weight loss journey, formal and home conditioning, and she can return to see me on an as-needed basis.

## 2022-12-26 DIAGNOSIS — I071 Rheumatic tricuspid insufficiency: Secondary | ICD-10-CM | POA: Insufficient documentation

## 2022-12-26 DIAGNOSIS — I340 Nonrheumatic mitral (valve) insufficiency: Secondary | ICD-10-CM | POA: Insufficient documentation

## 2023-01-12 ENCOUNTER — Telehealth: Payer: Self-pay | Admitting: Sports Medicine

## 2023-01-12 NOTE — Telephone Encounter (Signed)
Trula Ore called from [facility] in [place] called she needs a signature for a plan of care on patient  Phone Number is [phone]

## 2023-01-12 NOTE — Telephone Encounter (Signed)
I have signed every PT order that has come through my box.

## 2023-01-30 ENCOUNTER — Telehealth: Payer: Self-pay

## 2023-01-30 ENCOUNTER — Other Ambulatory Visit: Payer: Self-pay | Admitting: Physician Assistant

## 2023-01-30 DIAGNOSIS — F419 Anxiety disorder, unspecified: Secondary | ICD-10-CM

## 2023-01-30 NOTE — Telephone Encounter (Signed)
Please follow up on this request. Thanks in advance.

## 2023-01-30 NOTE — Telephone Encounter (Signed)
Copied from CRM [id number]. Topic: Clinical - Home Health Verbal Orders >> Jan 28, 2023  4:10 PM Dennison Nancy wrote: Caller/Agency: Ilda Foil Number: [phone] Service Requested: plan of care need to be signed off   Frequency: n/a Any new concerns about the patient? N/A

## 2023-02-27 ENCOUNTER — Encounter: Payer: Self-pay | Admitting: Physician Assistant

## 2023-02-27 ENCOUNTER — Ambulatory Visit (INDEPENDENT_AMBULATORY_CARE_PROVIDER_SITE_OTHER): Payer: Medicare Other | Admitting: Physician Assistant

## 2023-02-27 VITALS — BP 135/70 | HR 55 | Ht 67.0 in | Wt 218.0 lb

## 2023-02-27 DIAGNOSIS — F419 Anxiety disorder, unspecified: Secondary | ICD-10-CM | POA: Diagnosis not present

## 2023-02-27 DIAGNOSIS — F321 Major depressive disorder, single episode, moderate: Secondary | ICD-10-CM

## 2023-02-27 MED ORDER — CLONAZEPAM 0.5 MG PO TABS
0.5000 mg | ORAL_TABLET | Freq: Two times a day (BID) | ORAL | 1 refills | Status: DC | PRN
Start: 2023-02-27 — End: 2023-09-21

## 2023-02-27 MED ORDER — DULOXETINE HCL 40 MG PO CPEP
1.0000 | ORAL_CAPSULE | Freq: Every morning | ORAL | 1 refills | Status: DC
Start: 2023-02-27 — End: 2023-08-26

## 2023-02-27 NOTE — Progress Notes (Signed)
 Established Patient Office Visit  Subjective   Patient ID: Kristy Jones, female    DOB: 02/22/1956  Age: 68 y.o. MRN: [id number]  Chief Complaint  Patient presents with   Medical Management of Chronic Issues    Mood fup weight loss     HPI Pt is a 68 yo female who presents to the clinic to follow up on mood.   She is doing great on cymbalta  and klonapin. She is in counseling and helping a lot. She feels better and losing weight on optivia. She lost 37lbs. She has energy. She denies any SI/HC.   Kristy Jones. 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/14/2014   Spondylosis of lumbar spine 06/18/2014   Muscle cramps 11/09/2014   Edema of right lower extremity 11/09/2014   Bruising 04/11/2015   Abnormal weight gain 04/11/2015   Eosinophilia 04/13/2015   SOB (shortness of breath) on exertion 07/16/2016   Bilateral lower extremity edema 07/16/2016   Medial epicondylitis, left 06/07/2017   Seasonal allergies 06/16/2018   Acne vulgaris 06/16/2018   Acute pain of right knee 06/16/2018   Right wrist pain 06/16/2018   Acute stress reaction 06/16/2018   No energy 12/28/2019   Allergic conjunctivitis of both eyes 07/31/2020   Class 2 obesity due to excess calories without serious comorbidity with body mass index (BMI) of 36.0 to 36.9 in adult 11/06/2020   Pain of left calf 07/30/2021   Welcome to Medicare preventive visit 08/30/2021   Bilateral cataracts 09/04/2021   Tear film insufficiency 09/04/2021   Caregiver stress 02/28/2022   Acute left ankle pain 06/05/2022   Depression, major, single episode, moderate (HCC) 11/07/2022   Chest tightness 11/10/2022   Grief 11/10/2022   Moderate episode of recurrent major depressive disorder (HCC) 11/10/2022   Trace mitral valve regurgitation 12/26/2022   Trace tricuspid valve  regurgitation 12/26/2022   Resolved Ambulatory Problems    Diagnosis Date Noted   HAND PAIN, LEFT 03/08/2009   EUSTACHIAN TUBE DYSFUNCTION, LEFT 08/12/2010   Acute sinusitis, unspecified 08/12/2010   DDD (degenerative disc disease), lumbar 06/14/2014   Piriformis syndrome 09/12/2014   Class 2 obesity due to excess calories without serious comorbidity with body mass index (BMI) of 38.0 to 38.9 in adult 04/11/2015   Weakness 07/16/2016   Stress at work 02/25/2017   Stress due to illness of family member 08/05/2019   Elevated blood pressure reading 12/28/2019   Past Medical History:  Diagnosis Date   Lichen sclerosus    Thyroid  disease       Review of Systems  All other systems reviewed and are negative.     Objective:     BP 135/70   Pulse (!) 55   Ht 5' 7 (1.702 m)   Wt 218 lb (98.9 kg)   SpO2 99%   BMI 34.14 kg/m  BP Readings from Last 3 Encounters:  02/27/23 135/70  11/10/22 136/84  11/07/22 131/69   Wt Readings from Last 3 Encounters:  02/27/23 218 lb (98.9 kg)  11/10/22 248 lb 8 oz (112.7 kg)  11/07/22 247 lb 4.8 oz (112.2 kg)    ..    02/27/2023    1:56 PM 11/10/2022    8:52 AM 11/07/2022   11:18 AM 02/26/2022    2:04 PM 07/30/2021    9:39 AM  Depression screen  PHQ 2/9  Decreased Interest 0 3 2 1  0  Down, Depressed, Hopeless 0 3 3 1  0  PHQ - 2 Score 0 6 5 2  0  Altered sleeping 0 3 3 1    Tired, decreased energy 0 3 3 1    Change in appetite 0 3 2 2    Feeling bad or failure about yourself  0 3 3 0   Trouble concentrating 0 0 0 2   Moving slowly or fidgety/restless 0 0 0 2   Suicidal thoughts 0 0 0 0   PHQ-9 Score 0 18 16 10    Difficult doing work/chores Not difficult at all Very difficult  Somewhat difficult    ..    02/27/2023    1:56 PM 11/10/2022    8:52 AM 02/26/2022    2:04 PM 11/06/2020   11:37 AM  GAD 7 : Generalized Anxiety Score  Nervous, Anxious, on Edge 1 2 2 2   Control/stop worrying 0 3 3 2   Worry too much - different things 0 3 3 2    Trouble relaxing 0 2 3 1   Restless 0 0 3 1  Easily annoyed or irritable 0 2 3 1   Afraid - awful might happen 0 3 3 1   Total GAD 7 Score 1 15 20 10   Anxiety Difficulty Not difficult at all Very difficult Very difficult Somewhat difficult      Physical Exam Constitutional:      Appearance: Normal appearance. She is obese.  HENT:     Head: Normocephalic.  Cardiovascular:     Rate and Rhythm: Normal rate.  Pulmonary:     Effort: Pulmonary effort is normal.  Neurological:     General: No focal deficit present.     Mental Status: She is alert and oriented to person, place, and time.  Psychiatric:        Mood and Affect: Mood normal.        The 10-year ASCVD risk score (Arnett DK, et al., 2019) is: 7.1%    Assessment & Plan:  Kristy Jones was seen today for medical management of chronic issues.  Diagnoses and all orders for this visit:  Depression, major, single episode, moderate (HCC) -     DULoxetine  HCl 40 MG CPEP; Take 1 capsule (40 mg total) by mouth every morning.  Anxiety -     DULoxetine  HCl 40 MG CPEP; Take 1 capsule (40 mg total) by mouth every morning. -     clonazePAM  (KLONOPIN ) 0.5 MG tablet; Take 1 tablet (0.5 mg total) by mouth 2 (two) times daily as needed for anxiety.    She is doing so much better Lost 37lbs on optivia  Exericising and feeling good PHQ and GAD numbers look so much better Continue cymbalta  Use klonapin as needed up to twice a day but try to use as little as you can Continue counseling Follow up in 6 months  Return in about 6 months (around 08/27/2023).    Naydene Kamrowski, PA-C

## 2023-03-02 ENCOUNTER — Telehealth: Payer: Self-pay | Admitting: Physician Assistant

## 2023-03-02 NOTE — Telephone Encounter (Signed)
 Copied from CRM [id number]. Topic: General - Other >> Mar 02, 2023  2:27 PM Brandy W wrote: Reason for CRM: Tawni from [facility]- physical therapy- calling in regards to a plan of care that was sent multiple times, has expired has not heard back yet. Confirmed docs were sent multiple times to [phone].    Callback: [phone] called [facility] and they provided an alternative number to fax to  [phone]. Tawni stated she would send docs again, and wait for callback

## 2023-03-15 ENCOUNTER — Other Ambulatory Visit: Payer: Self-pay | Admitting: Sports Medicine

## 2023-03-15 DIAGNOSIS — M47816 Spondylosis without myelopathy or radiculopathy, lumbar region: Secondary | ICD-10-CM

## 2023-06-23 ENCOUNTER — Telehealth: Payer: Self-pay

## 2023-06-23 NOTE — Telephone Encounter (Signed)
 Copied from CRM [id number]. Topic: Clinical - Request for Lab/Test Order >> Jun 23, 2023 12:27 PM Eleanore Grey wrote: Reason for CRM: Patient says she has lost 71 pounds since October. Wants labwork to just check her thyroid . Doesn't want an office visit. Would like lab order sent to [facility] at [address] in [place] and says other labs can be added to order if needed. Patient would like to be notified once labs are in, says MyChart communication is fine.

## 2023-06-24 ENCOUNTER — Encounter: Payer: Self-pay | Admitting: Physician Assistant

## 2023-06-24 ENCOUNTER — Other Ambulatory Visit: Payer: Self-pay

## 2023-06-24 DIAGNOSIS — E039 Hypothyroidism, unspecified: Secondary | ICD-10-CM

## 2023-06-24 DIAGNOSIS — R634 Abnormal weight loss: Secondary | ICD-10-CM

## 2023-06-24 NOTE — Telephone Encounter (Signed)
 Faxed order to [facility]  Jayson Michael at fax # [phone]

## 2023-06-26 ENCOUNTER — Encounter: Payer: Self-pay | Admitting: Physician Assistant

## 2023-06-26 ENCOUNTER — Other Ambulatory Visit: Payer: Self-pay | Admitting: Physician Assistant

## 2023-06-26 LAB — COMPREHENSIVE METABOLIC PANEL WITH GFR
AG Ratio: 1.8 (calc) (ref 1.0–2.5)
ALT: 21 U/L (ref 6–29)
AST: 21 U/L (ref 10–35)
Albumin: 4.4 g/dL (ref 3.6–5.1)
Alkaline phosphatase (APISO): 66 U/L (ref 37–153)
BUN: 18 mg/dL (ref 7–25)
CO2: 30 mmol/L (ref 20–32)
Calcium: 9.9 mg/dL (ref 8.6–10.4)
Chloride: 104 mmol/L (ref 98–110)
Creat: 0.72 mg/dL (ref 0.50–1.05)
Globulin: 2.4 g/dL (ref 1.9–3.7)
Glucose, Bld: 86 mg/dL (ref 65–99)
Potassium: 4.3 mmol/L (ref 3.5–5.3)
Sodium: 140 mmol/L (ref 135–146)
Total Bilirubin: 0.7 mg/dL (ref 0.2–1.2)
Total Protein: 6.8 g/dL (ref 6.1–8.1)
eGFR: 92 mL/min/{1.73_m2} (ref >=60)

## 2023-06-26 LAB — TSH+FREE T4: TSH W/REFLEX TO FT4: 0.21 m[IU]/L — ABNORMAL LOW (ref 0.40–4.50)

## 2023-06-26 LAB — T4, FREE: Free T4: 1.3 ng/dL (ref 0.8–1.8)

## 2023-06-26 MED ORDER — LIOTHYRONINE SODIUM 5 MCG PO TABS
5.0000 ug | ORAL_TABLET | Freq: Every day | ORAL | 0 refills | Status: DC
Start: 2023-06-26 — End: 2023-12-25

## 2023-06-26 MED ORDER — LEVOTHYROXINE SODIUM 88 MCG PO TABS: 88.0000 ug | ORAL_TABLET | Freq: Every day | ORAL | 0 refills | Status: DC

## 2023-06-26 NOTE — Telephone Encounter (Signed)
 Lab results faxed to Dr. Kipp People  at fax# [phone]

## 2023-06-26 NOTE — Progress Notes (Signed)
 TSH is trending towards hyperthyroidism your free levels still in normal range. Lets decrease levothyroxine  to 88mcg daily and recheck in 3 months.

## 2023-06-30 ENCOUNTER — Encounter: Payer: Self-pay | Admitting: Physician Assistant

## 2023-06-30 ENCOUNTER — Ambulatory Visit (INDEPENDENT_AMBULATORY_CARE_PROVIDER_SITE_OTHER): Admitting: Physician Assistant

## 2023-06-30 ENCOUNTER — Ambulatory Visit (INDEPENDENT_AMBULATORY_CARE_PROVIDER_SITE_OTHER)

## 2023-06-30 VITALS — BP 137/69 | HR 64 | Ht 67.0 in | Wt 184.0 lb

## 2023-06-30 DIAGNOSIS — R29898 Other symptoms and signs involving the musculoskeletal system: Secondary | ICD-10-CM

## 2023-06-30 NOTE — Progress Notes (Signed)
 Normal findings of bony overgrowth. GREAT news. No intervention needed.

## 2023-07-01 ENCOUNTER — Encounter: Payer: Self-pay | Admitting: Physician Assistant

## 2023-07-01 NOTE — Progress Notes (Signed)
   Acute Office Visit  Subjective:     Patient ID: Kristy Jones, female    DOB: Feb 01, 1956, 68 y.o.   MRN: [id number]  Chief Complaint  Patient presents with   Breast Mass    Onset 1 month  sub sternum hard to touch, vaccine questions (shingles)    HPI Patient is in today for prominent bony mass at the tip of her sternum for the last month she has noticed it.  If she presses hard is a little tender. She has lost 70lbs with optivia and exercise.   Review of Systems  All other systems reviewed and are negative.       Objective:    BP 137/69   Pulse 64   Ht 5\' 7"  (1.702 m)   Wt 184 lb (83.5 kg)   SpO2 99%   BMI 28.82 kg/m  BP Readings from Last 3 Encounters:  06/30/23 137/69  02/27/23 135/70  11/10/22 136/84   Wt Readings from Last 3 Encounters:  06/30/23 184 lb (83.5 kg)  02/27/23 218 lb (98.9 kg)  11/10/22 248 lb 8 oz (112.7 kg)      Physical Exam Constitutional:      Appearance: Normal appearance.  HENT:     Head: Normocephalic.  Cardiovascular:     Rate and Rhythm: Normal rate.  Pulmonary:     Effort: Pulmonary effort is normal.     Breath sounds: Normal breath sounds.     Comments: Prominent xiphoid process with mild tenderness to palpation. No redness, warmth or swelling.  Abdominal:     General: Bowel sounds are normal. There is no distension.     Palpations: Abdomen is soft. There is no mass.     Tenderness: There is no abdominal tenderness. There is no guarding or rebound.  Musculoskeletal:     Right lower leg: No edema.     Left lower leg: No edema.  Neurological:     General: No focal deficit present.     Mental Status: She is alert and oriented to person, place, and time.  Psychiatric:        Mood and Affect: Mood normal.         Assessment & Plan:  Aaron AasAaron AasPahola was seen today for breast mass.  Diagnoses and all orders for this visit:  Xiphoid prominence -     DG Chest 2 View; Future   Xray to confirm no bony masses or abnormal  presentation.  Reassurance given.  Encouraged to go to pharmacy and get shingles shot.  Encouraged to get MAW visit.   Abigail Teall, PA-C

## 2023-08-25 ENCOUNTER — Other Ambulatory Visit: Payer: Self-pay | Admitting: Physician Assistant

## 2023-08-25 DIAGNOSIS — F321 Major depressive disorder, single episode, moderate: Secondary | ICD-10-CM

## 2023-08-25 DIAGNOSIS — F419 Anxiety disorder, unspecified: Secondary | ICD-10-CM

## 2023-08-31 ENCOUNTER — Ambulatory Visit: Payer: Medicare Other | Admitting: Physician Assistant

## 2023-09-20 ENCOUNTER — Other Ambulatory Visit: Payer: Self-pay | Admitting: Physician Assistant

## 2023-09-21 ENCOUNTER — Other Ambulatory Visit: Payer: Self-pay | Admitting: Physician Assistant

## 2023-09-21 DIAGNOSIS — F419 Anxiety disorder, unspecified: Secondary | ICD-10-CM

## 2023-09-21 NOTE — Telephone Encounter (Signed)
 Refill request received. Routing to Dr. Alvan for review with Jade being off. Please advise.

## 2023-09-23 ENCOUNTER — Other Ambulatory Visit: Payer: Self-pay | Admitting: Physician Assistant

## 2023-09-23 DIAGNOSIS — F419 Anxiety disorder, unspecified: Secondary | ICD-10-CM

## 2023-09-23 MED ORDER — CLONAZEPAM 0.5 MG PO TABS: 0.5000 mg | ORAL_TABLET | Freq: Two times a day (BID) | ORAL | 0 refills | Status: DC | PRN

## 2023-09-23 NOTE — Telephone Encounter (Signed)
 Copied from CRM [id number]. Topic: Clinical - Medication Refill >> Sep 23, 2023 12:10 PM Carrielelia G wrote: Medication: clonazePAM  (KLONOPIN ) 0.5 MG tabl  Has the patient contacted their pharmacy? Yes  (Agent: If no, request that the patient contact the pharmacy for the refill. If patient does not wish to contact the pharmacy document the reason why and proceed with request.) (Agent: If yes, when and what did the pharmacy advise?)  This is the patient's preferred pharmacy:  [id number] - [place], [place] - [address] [place] [address] [place] [address] [place] [address] Phone: [phone] Fax: [phone]  Is this the correct pharmacy for this prescription? Yes If no, delete pharmacy and type the correct one.     Is the patient out of the medication? No (has a few days left)   Has the patient been seen for an appointment in the last year OR does the patient have an upcoming appointment? Yes  Can we respond through MyChart? Yes  Agent: Please be advised that Rx refills may take up to 3 business days. We ask that you follow-up with your pharmacy.

## 2023-09-23 NOTE — Telephone Encounter (Signed)
 Last filled 09/21/2023  Last OV 06/30/2023  Upcoming appointment with Vermell Bologna 09/30/2023

## 2023-09-30 ENCOUNTER — Ambulatory Visit (INDEPENDENT_AMBULATORY_CARE_PROVIDER_SITE_OTHER): Admitting: Physician Assistant

## 2023-09-30 ENCOUNTER — Encounter: Payer: Self-pay | Admitting: Physician Assistant

## 2023-09-30 VITALS — BP 118/66 | HR 54 | Ht 67.0 in | Wt 172.0 lb

## 2023-09-30 DIAGNOSIS — E039 Hypothyroidism, unspecified: Secondary | ICD-10-CM | POA: Diagnosis not present

## 2023-09-30 DIAGNOSIS — F419 Anxiety disorder, unspecified: Secondary | ICD-10-CM

## 2023-09-30 DIAGNOSIS — R29898 Other symptoms and signs involving the musculoskeletal system: Secondary | ICD-10-CM

## 2023-09-30 DIAGNOSIS — E663 Overweight: Secondary | ICD-10-CM | POA: Insufficient documentation

## 2023-09-30 DIAGNOSIS — F321 Major depressive disorder, single episode, moderate: Secondary | ICD-10-CM

## 2023-09-30 MED ORDER — DULOXETINE HCL 30 MG PO CPEP: 30.0000 mg | ORAL_CAPSULE | Freq: Every day | ORAL | 0 refills | Status: DC

## 2023-09-30 NOTE — Progress Notes (Unsigned)
 Established Patient Office Visit  Subjective   Patient ID: Kristy Jones, female    DOB: 1956/01/08  Age: 68 y.o. MRN: [id number]  No chief complaint on file.   HPI Pt is a 68 yo female who presents to the clinic for medication follow up.   She has hypothyroidism and on synthroid  and cytomel . She has lost over 30lbs in the last year and wants to keep a check on her thyroid . She feels great. She has been doing the [place] nutrition program. She is transitioning off right now to real food. She is walking every day. She is eating less and feels great. Her mood is much better and would like to come down off cymbalta . Her relationship with her younger son/daughter in law is much better. She is not seeing BH anymore. She rarely uses klonapin but still likes to keep on hand.   ROS See HPI.    Objective:     BP 118/66   Pulse (!) 54   Ht 5' 7 (1.702 m)   Wt 172 lb (78 kg)   SpO2 99%   BMI 26.94 kg/m  BP Readings from Last 3 Encounters:  09/30/23 118/66  06/30/23 137/69  02/27/23 135/70   Wt Readings from Last 3 Encounters:  09/30/23 172 lb (78 kg)  06/30/23 184 lb (83.5 kg)  02/27/23 218 lb (98.9 kg)    ..    10/02/2023    7:01 AM 07/01/2023    6:33 AM 02/27/2023    1:56 PM 11/10/2022    8:52 AM 11/07/2022   11:18 AM  Depression screen PHQ 2/9  Decreased Interest 0 0 0 3 2  Down, Depressed, Hopeless 0 0 0 3 3  PHQ - 2 Score 0 0 0 6 5  Altered sleeping   0 3 3  Tired, decreased energy   0 3 3  Change in appetite   0 3 2  Feeling bad or failure about yourself    0 3 3  Trouble concentrating   0 0 0  Moving slowly or fidgety/restless   0 0 0  Suicidal thoughts   0 0 0  PHQ-9 Score   0 18 16  Difficult doing work/chores   Not difficult at all Very difficult    ..    10/02/2023    7:01 AM 02/27/2023    1:56 PM 11/10/2022    8:52 AM 02/26/2022    2:04 PM  GAD 7 : Generalized Anxiety Score  Nervous, Anxious, on Edge 0 1 2 2   Control/stop worrying 0 0 3 3  Worry too much -  different things 0 0 3 3  Trouble relaxing 0 0 2 3  Restless 0 0 0 3  Easily annoyed or irritable 0 0 2 3  Afraid - awful might happen 0 0 3 3  Total GAD 7 Score 0 1 15 20   Anxiety Difficulty Not difficult at all Not difficult at all Very difficult Very difficult      Physical Exam Constitutional:      Appearance: Normal appearance.  HENT:     Head: Normocephalic.  Cardiovascular:     Rate and Rhythm: Normal rate and regular rhythm.  Pulmonary:     Effort: Pulmonary effort is normal.     Breath sounds: Normal breath sounds.  Musculoskeletal:     Cervical back: Normal range of motion and neck supple. No tenderness.     Right lower leg: No edema.     Left lower  leg: No edema.  Neurological:     General: No focal deficit present.     Mental Status: She is alert and oriented to person, place, and time.  Psychiatric:        Mood and Affect: Mood normal.      The 10-year ASCVD risk score (Arnett DK, et al., 2019) is: 5.5%    Assessment & Plan:  SABRASABRADiagnoses and all orders for this visit:  Acquired hypothyroidism -     TSH + free T4 -     T3, free  Depression, major, single episode, moderate (HCC) -     DULoxetine  (CYMBALTA ) 30 MG capsule; Take 1 capsule (30 mg total) by mouth daily.  Anxiety -     DULoxetine  (CYMBALTA ) 30 MG capsule; Take 1 capsule (30 mg total) by mouth daily.  Overweight (BMI 25.0-29.9)   Last TSH trending on low side-recheck panel today and adjust medications accordingly PHQ/GAD looks great Decrease to 30mg  of cymbalta  will can continue to taper down as needed slowly once done with that prescription if still doing well go to 20mg  daily.  Pt is in the overweight BMI-keep up the good work! Follow up in 6 months    Return in about 6 months (around 04/01/2024).    Caela Huot, PA-C

## 2023-09-30 NOTE — Patient Instructions (Signed)
 Decrease cymbalta  back down to 30mg  daily Labs ordered today

## 2023-10-01 LAB — TSH+FREE T4
Free T4: 1.22 ng/dL (ref 0.82–1.77)
TSH: 0.376 u[IU]/mL — ABNORMAL LOW (ref 0.450–4.500)

## 2023-10-01 LAB — T3, FREE: T3, Free: 2 pg/mL (ref 2.0–4.4)

## 2023-10-02 ENCOUNTER — Ambulatory Visit: Payer: Self-pay | Admitting: Physician Assistant

## 2023-10-02 ENCOUNTER — Encounter: Payer: Self-pay | Admitting: Physician Assistant

## 2023-10-02 NOTE — Progress Notes (Signed)
 Your free T4 looks great.  Your T3 on the low side.  Your TSH which should be showing you are leaning towards a hyperthyroid(too much state)  If you are feeling good. I would stay at same dosing and recheck in 3 months. If we did anything I would suggest a slight cytomel  increase to help your T3.

## 2023-10-27 ENCOUNTER — Encounter: Payer: Self-pay | Admitting: Sports Medicine

## 2023-11-06 ENCOUNTER — Other Ambulatory Visit: Payer: Self-pay | Admitting: Physician Assistant

## 2023-11-06 DIAGNOSIS — R0602 Shortness of breath: Secondary | ICD-10-CM

## 2023-11-06 DIAGNOSIS — R600 Localized edema: Secondary | ICD-10-CM

## 2023-11-06 DIAGNOSIS — R0789 Other chest pain: Secondary | ICD-10-CM

## 2023-11-17 ENCOUNTER — Encounter: Payer: Self-pay | Admitting: Physician Assistant

## 2023-11-17 ENCOUNTER — Ambulatory Visit (INDEPENDENT_AMBULATORY_CARE_PROVIDER_SITE_OTHER)

## 2023-11-17 VITALS — Ht 67.0 in | Wt 166.0 lb

## 2023-11-17 DIAGNOSIS — Z0000 Encounter for general adult medical examination without abnormal findings: Secondary | ICD-10-CM | POA: Diagnosis not present

## 2023-11-17 NOTE — Patient Instructions (Signed)
  Ms. Bellot , Thank you for taking time to come for your Medicare Wellness Visit. I appreciate your ongoing commitment to your health goals. Please review the following plan we discussed and let me know if I can assist you in the future.   These are the goals we discussed:  Goals      Patient Stated     Patient states she would like to walk more and stay her healthy weight.         This is a list of the screening recommended for you and due dates:  Health Maintenance  Topic Date Due   Flu Shot  09/25/2023   Breast Cancer Screening  01/29/2024   DTaP/Tdap/Td vaccine (2 - Td or Tdap) 03/24/2024   Medicare Annual Wellness Visit  11/16/2024   Colon Cancer Screening  09/16/2027   Pneumococcal Vaccine for age over 19  Completed   Hepatitis C Screening  Completed   HPV Vaccine  Aged Out   Meningitis B Vaccine  Aged Out   DEXA scan (bone density measurement)  Discontinued   COVID-19 Vaccine  Discontinued   Zoster (Shingles) Vaccine  Discontinued

## 2023-11-17 NOTE — Progress Notes (Signed)
 Subjective:   Mollye Guinta is a 68 y.o. female who presents for Medicare Annual (Subsequent) preventive examination.  Visit Complete: Virtual I connected with  Vina Cuff on 11/17/23 by a audio enabled telemedicine application and verified that I am speaking with the correct person using two identifiers.  Patient Location: Home  Provider Location: Office/Clinic  I discussed the limitations of evaluation and management by telemedicine. The patient expressed understanding and agreed to proceed.  Vital Signs: Because this visit was a virtual/telehealth visit, some criteria may be missing or patient reported. Any vitals not documented were not able to be obtained and vitals that have been documented are patient reported.  Patient Medicare AWV questionnaire was completed by the patient on n/a; I have confirmed that all information answered by patient is correct and no changes since this date.  Cardiac Risk Factors include: advanced age (>26men, >57 women)     Objective:    Today's Vitals   11/17/23 1302  Weight: 166 lb (75.3 kg)  Height: 5' 7 (1.702 m)   Body mass index is 26 kg/m.     11/17/2023    1:21 PM 10/24/2014   11:17 AM 07/06/2013    8:20 AM  Advanced Directives  Does Patient Have a Medical Advance Directive? No No  Patient does not have advance directive;Patient would not like information   Would patient like information on creating a medical advance directive? No - Patient declined No - patient declined information       Data saved with a previous flowsheet row definition    Current Medications (verified) Outpatient Encounter Medications as of 11/17/2023  Medication Sig   Adapalene  0.3 % gel APPLY TOPICALLY EVERY NIGHT AT BEDTIME   Cholecalciferol (VITAMIN D -3 PO) Take 2,000 Units by mouth daily.   clobetasol cream (TEMOVATE) 0.05 % Apply 1 application topically 2 (two) times daily.   clonazePAM  (KLONOPIN ) 0.5 MG tablet Take 1 tablet (0.5 mg total) by mouth  2 (two) times daily as needed for anxiety.   DULoxetine  (CYMBALTA ) 30 MG capsule Take 1 capsule (30 mg total) by mouth daily.   levothyroxine  (SYNTHROID ) 88 MCG tablet TAKE 1 TABLET BY MOUTH EVERY DAY   liothyronine  (CYTOMEL ) 5 MCG tablet Take 1 tablet (5 mcg total) by mouth daily.   magnesium 30 MG tablet Take 30 mg by mouth 2 (two) times daily.   Zinc Sulfate (ZINC 15 PO) Take by mouth.   acetaminophen  (TYLENOL ) 650 MG CR tablet Take 1 tablet (650 mg total) by mouth every 8 (eight) hours as needed for pain. (Patient not taking: Reported on 11/17/2023)   meloxicam  (MOBIC ) 15 MG tablet TAKE ONE TAB BY MOUTH DAILY WITH A MEAL FOR 2 WEEKS, THEN ONCE EVERY 24 HOURS AS NEEDED FOR PAIN. (Patient not taking: Reported on 11/17/2023)   No facility-administered encounter medications on file as of 11/17/2023.    Allergies (verified) Codeine, Tetracyclines & related, and Vicodin [hydrocodone-acetaminophen ]   History: Past Medical History:  Diagnosis Date   Lichen sclerosus    Thyroid  disease    Past Surgical History:  Procedure Laterality Date   c-sections     SEPTOPLASTY     Family History  Problem Relation Age of Onset   Alcohol abuse Other        grandfather   Prostate cancer Father    Depression Mother        sister   Social History   Socioeconomic History   Marital status: Divorced    Spouse name:  Not on file   Number of children: Not on file   Years of education: Not on file   Highest education level: Not on file  Occupational History   Not on file  Tobacco Use   Smoking status: Former    Current packs/day: 0.00    Types: Cigarettes    Start date: 06/06/1971    Quit date: 06/06/2007    Years since quitting: 16.4   Smokeless tobacco: Never  Vaping Use   Vaping status: Never Used  Substance and Sexual Activity   Alcohol use: Yes    Alcohol/week: 6.0 standard drinks of alcohol    Types: 6 Standard drinks or equivalent per week   Drug use: No   Sexual activity: Not  Currently  Other Topics Concern   Not on file  Social History Narrative   Skyra lives alone with her dog and cat. She has 2 sons. She enjoys gardening and crafts.    Social Drivers of Corporate investment banker Strain: Low Risk  (11/17/2023)   Overall Financial Resource Strain (CARDIA)    Difficulty of Paying Living Expenses: Not hard at all  Food Insecurity: No Food Insecurity (11/17/2023)   Hunger Vital Sign    Worried About Running Out of Food in the Last Year: Never true    Ran Out of Food in the Last Year: Never true  Transportation Needs: No Transportation Needs (11/17/2023)   PRAPARE - Administrator, Civil Service (Medical): No    Lack of Transportation (Non-Medical): No  Physical Activity: Sufficiently Active (11/17/2023)   Exercise Vital Sign    Days of Exercise per Week: 7 days    Minutes of Exercise per Session: 40 min  Stress: No Stress Concern Present (11/17/2023)   [employer] of Occupational Health - Occupational Stress Questionnaire    Feeling of Stress: Not at all  Social Connections: Moderately Integrated (11/17/2023)   Social Connection and Isolation Panel    Frequency of Communication with Friends and Family: More than three times a week    Frequency of Social Gatherings with Friends and Family: More than three times a week    Attends Religious Services: More than 4 times per year    Active Member of [employer] or Organizations: Yes    Attends Engineer, structural: More than 4 times per year    Marital Status: Divorced    Tobacco Counseling Counseling given: Not Answered   Clinical Intake:  Pre-visit preparation completed: Yes  Pain : No/denies pain     BMI - recorded: 26 Nutritional Status: BMI 25 -29 Overweight Nutritional Risks: None Diabetes: No  How often do you need to have someone help you when you read instructions, pamphlets, or other written materials from your doctor or pharmacy?: 1 - Never What is the last grade  level you completed in school?: 13  Interpreter Needed?: No      Activities of Daily Living    11/17/2023    1:05 PM  In your present state of health, do you have any difficulty performing the following activities:  Hearing? 0  Vision? 0  Difficulty concentrating or making decisions? 0  Walking or climbing stairs? 0  Dressing or bathing? 0  Doing errands, shopping? 0  Preparing Food and eating ? N  Using the Toilet? N  In the past six months, have you accidently leaked urine? Y  Do you have problems with loss of bowel control? N  Managing your Medications? N  Managing your Finances? N  Housekeeping or managing your Housekeeping? N    Patient Care Team: Breeback, Jade L, PA-C as PCP - General (Family Medicine) Aura Solomons, MD (Endocrinology)  Indicate any recent Medical Services you may have received from other than [facility] providers in the past year (date may be approximate).     Assessment:   This is a routine wellness examination for Fithian.  Hearing/Vision screen No results found.   Goals Addressed             This Visit's Progress    Patient Stated       Patient states she would like to walk more and stay her healthy weight.        Depression Screen    11/17/2023    1:17 PM 10/02/2023    7:01 AM 07/01/2023    6:33 AM 02/27/2023    1:56 PM 11/10/2022    8:52 AM 11/07/2022   11:18 AM 02/26/2022    2:04 PM  PHQ 2/9 Scores  PHQ - 2 Score 0 0 0 0 6 5 2   PHQ- 9 Score    0 18 16 10     Fall Risk    11/17/2023    1:21 PM 11/10/2022    8:52 AM 02/26/2022    2:05 PM 02/26/2022    1:35 PM 09/10/2021    8:02 AM  Fall Risk   Falls in the past year? 1 1 0 0 0  Number falls in past yr: 1 1 0 0 0  Injury with Fall? 0 1 0 0 0  Risk for fall due to : History of fall(s) History of fall(s) No Fall Risks No Fall Risks Orthopedic patient  Follow up Falls evaluation completed Falls evaluation completed Falls evaluation completed  Falls evaluation completed  Falls evaluation  completed;Education provided      Data saved with a previous flowsheet row definition    MEDICARE RISK AT HOME: Medicare Risk at Home Any stairs in or around the home?: Yes If so, are there any without handrails?: Yes Home free of loose throw rugs in walkways, pet beds, electrical cords, etc?: Yes Adequate lighting in your home to reduce risk of falls?: Yes Life alert?: No Use of a cane, walker or w/c?: No Grab bars in the bathroom?: Yes Shower chair or bench in shower?: Yes Elevated toilet seat or a handicapped toilet?: No  TIMED UP AND GO:  Was the test performed?  No    Cognitive Function:        11/17/2023    1:23 PM 08/30/2021   10:37 AM  6CIT Screen  What Year? 0 points 0 points  What month? 0 points 0 points  What time? 0 points 0 points  Count back from 20 0 points 0 points  Months in reverse 0 points 0 points  Repeat phrase 0 points 2 points  Total Score 0 points 2 points    Immunizations Immunization History  Administered Date(s) Administered   Influenza,inj,Quad PF,6+ Mos 12/29/2012   Influenza-Unspecified 11/03/2013, 12/18/2017, 11/29/2018   Janssen (J&J) SARS-COV-2 Vaccination 11/23/2019   PNEUMOCOCCAL CONJUGATE-20 08/30/2021   Tdap 03/24/2014    TDAP status: Up to date  Flu Vaccine status: Due, Education has been provided regarding the importance of this vaccine. Advised may receive this vaccine at local pharmacy or Health Dept. Aware to provide a copy of the vaccination record if obtained from local pharmacy or Health Dept. Verbalized acceptance and understanding.  Pneumococcal vaccine status: Up to  date  Covid-19 vaccine status: Declined, Education has been provided regarding the importance of this vaccine but patient still declined. Advised may receive this vaccine at local pharmacy or Health Dept.or vaccine clinic. Aware to provide a copy of the vaccination record if obtained from local pharmacy or Health Dept. Verbalized acceptance and  understanding.  Qualifies for Shingles Vaccine? Yes   Zostavax completed No   Shingrix Completed?: No.    Education has been provided regarding the importance of this vaccine. Patient has been advised to call insurance company to determine out of pocket expense if they have not yet received this vaccine. Advised may also receive vaccine at local pharmacy or Health Dept. Verbalized acceptance and understanding.  Screening Tests Health Maintenance  Topic Date Due   Influenza Vaccine  09/25/2023   Mammogram  01/29/2024   DTaP/Tdap/Td (2 - Td or Tdap) 03/24/2024   Medicare Annual Wellness (AWV)  11/16/2024   Colonoscopy  09/16/2027   Pneumococcal Vaccine: 50+ Years  Completed   Hepatitis C Screening  Completed   HPV VACCINES  Aged Out   Meningococcal B Vaccine  Aged Out   DEXA SCAN  Discontinued   COVID-19 Vaccine  Discontinued   Zoster Vaccines- Shingrix  Discontinued    Health Maintenance  Health Maintenance Due  Topic Date Due   Influenza Vaccine  09/25/2023    Colorectal cancer screening: Type of screening: Colonoscopy. Completed 09/15/2017. Repeat every 10 years  Mammogram status: Completed 2025. Repeat every year  Bone Density - declined   Lung Cancer Screening: (Low Dose CT Chest recommended if Age 79-80 years, 20 pack-year currently smoking OR have quit w/in 15years.) does not qualify.   Lung Cancer Screening Referral: n/a  Additional Screening:  Hepatitis C Screening: does qualify; Completed 09/01/2017  Vision Screening: Recommended annual ophthalmology exams for early detection of glaucoma and other disorders of the eye. Is the patient up to date with their annual eye exam?  Yes  Who is the provider or what is the name of the office in which the patient attends annual eye exams? Dr Whitman If pt is not established with a provider, would they like to be referred to a provider to establish care? N/a.   Dental Screening: Recommended annual dental exams for proper oral  hygiene   Community Resource Referral / Chronic Care Management: CRR required this visit?  No   CCM required this visit?  No     Plan:     I have personally reviewed and noted the following in the patient's chart:   Medical and social history Use of alcohol, tobacco or illicit drugs  Current medications and supplements including opioid prescriptions. Patient is not currently taking opioid prescriptions. Functional ability and status Nutritional status Physical activity Advanced directives List of other physicians Hospitalizations, surgeries, and ER visits in previous 12 months. None Vitals Screenings to include cognitive, depression, and falls Referrals and appointments  In addition, I have reviewed and discussed with patient certain preventive protocols, quality metrics, and best practice recommendations. A written personalized care plan for preventive services as well as general preventive health recommendations were provided to patient.     Bonny Jon Mayor, [place]   11/17/2023   After Visit Summary: (MyChart) Due to this being a telephonic visit, the after visit summary with patients personalized plan was offered to patient via MyChart   Nurse Notes:   Parrie Rasco is a 68 y.o. female patient of Vermell Bologna, PA-C who had a Medicare Annual Wellness Visit  today via telephone. She reports that he is socially active and does interact with friends/family regularly. She is moderately physically active. She enjoys gardening and doing crafts.

## 2023-12-20 ENCOUNTER — Other Ambulatory Visit: Payer: Self-pay | Admitting: Physician Assistant

## 2023-12-20 ENCOUNTER — Other Ambulatory Visit: Payer: Self-pay | Admitting: Family Medicine

## 2023-12-20 DIAGNOSIS — F419 Anxiety disorder, unspecified: Secondary | ICD-10-CM

## 2023-12-23 ENCOUNTER — Encounter: Payer: Self-pay | Admitting: Physician Assistant

## 2023-12-23 DIAGNOSIS — F419 Anxiety disorder, unspecified: Secondary | ICD-10-CM

## 2023-12-23 DIAGNOSIS — F321 Major depressive disorder, single episode, moderate: Secondary | ICD-10-CM

## 2023-12-24 MED ORDER — LEVOTHYROXINE SODIUM 88 MCG PO TABS: 88.0000 ug | ORAL_TABLET | Freq: Every day | ORAL | 0 refills | Status: DC

## 2023-12-24 NOTE — Telephone Encounter (Signed)
 Jade,  I have sent refill of  the Levothyroxine  as 30 day supply and sent a message to the patient that is time for the repeat thyroid  testing.  Please let me know what lab testing you are wanting to draw other than TSH and I will place the order in the chart.

## 2023-12-25 MED ORDER — DULOXETINE HCL 20 MG PO CPEP: 20.0000 mg | ORAL_CAPSULE | Freq: Every day | ORAL | 1 refills | Status: AC

## 2023-12-25 MED ORDER — LEVOTHYROXINE SODIUM 88 MCG PO TABS: 88.0000 ug | ORAL_TABLET | Freq: Every day | ORAL | 3 refills | Status: AC

## 2023-12-25 MED ORDER — LIOTHYRONINE SODIUM 5 MCG PO TABS: 5.0000 ug | ORAL_TABLET | Freq: Every day | ORAL | 3 refills | Status: AC

## 2023-12-25 NOTE — Telephone Encounter (Signed)
  Patient states Dr, Aura recently checked her TSH and told her to continue at current strength  ( Please see message from patient)  O.k. to send TSH again with refills? Last OV 08/06/225-  Patient also wanting to know if any way to get the liothyronine  cheaper than $30 for 3 month supply .? States her levothyroxine  is only $7 for 30 day supply.  Duloxetine  refill Last written 09/30/2023 Last OV 08/06/225-notes read  Decrease to 30mg  of cymbalta  will can continue to taper down as needed slowly once done with that prescription if still doing well go to 20mg  daily.   Patient states she is doing well and is wanting to reduce  to the 20mg   ( She is also wanting to know how long she will be on the 20mg  before reducing to 10mg  )  Upcoming appt 04/01/2024

## 2023-12-27 ENCOUNTER — Other Ambulatory Visit: Payer: Self-pay | Admitting: Physician Assistant

## 2023-12-27 DIAGNOSIS — F419 Anxiety disorder, unspecified: Secondary | ICD-10-CM

## 2023-12-27 DIAGNOSIS — F321 Major depressive disorder, single episode, moderate: Secondary | ICD-10-CM

## 2024-01-25 ENCOUNTER — Encounter: Payer: Self-pay | Admitting: Physician Assistant

## 2024-01-25 DIAGNOSIS — F419 Anxiety disorder, unspecified: Secondary | ICD-10-CM

## 2024-01-27 MED ORDER — CLONAZEPAM 0.5 MG PO TABS: 0.5000 mg | ORAL_TABLET | Freq: Two times a day (BID) | ORAL | 0 refills | Status: DC | PRN

## 2024-02-25 ENCOUNTER — Encounter: Payer: Self-pay | Admitting: Physician Assistant

## 2024-03-02 ENCOUNTER — Encounter: Payer: Self-pay | Admitting: Physician Assistant

## 2024-03-02 DIAGNOSIS — E039 Hypothyroidism, unspecified: Secondary | ICD-10-CM

## 2024-03-04 ENCOUNTER — Ambulatory Visit: Payer: Self-pay | Admitting: Physician Assistant

## 2024-03-04 LAB — COMPLETE METABOLIC PANEL WITHOUT GFR
AG Ratio: 1.9 (calc) (ref 1.0–2.5)
ALT: 16 U/L (ref 6–29)
AST: 17 U/L (ref 10–35)
Albumin: 3.9 g/dL (ref 3.6–5.1)
Alkaline phosphatase (APISO): 53 U/L (ref 37–153)
BUN: 17 mg/dL (ref 7–25)
CO2: 30 mmol/L (ref 20–32)
Calcium: 9.1 mg/dL (ref 8.6–10.4)
Chloride: 105 mmol/L (ref 98–110)
Creat: 0.66 mg/dL (ref 0.50–1.05)
Globulin: 2.1 g/dL (ref 1.9–3.7)
Glucose, Bld: 87 mg/dL (ref 65–99)
Potassium: 4.5 mmol/L (ref 3.5–5.3)
Sodium: 140 mmol/L (ref 135–146)
Total Bilirubin: 0.5 mg/dL (ref 0.2–1.2)
Total Protein: 6 g/dL — ABNORMAL LOW (ref 6.1–8.1)

## 2024-03-04 LAB — T3, FREE: T3, Free: 3.1 pg/mL (ref 2.3–4.2)

## 2024-03-04 LAB — TSH+FREE T4: TSH W/REFLEX TO FT4: 0.84 m[IU]/L (ref 0.40–4.50)

## 2024-03-04 NOTE — Progress Notes (Signed)
 Vina,   Thyroid  looks great!  Kidney and liver look great.  Protein low. Increase protein in diet!

## 2024-04-01 ENCOUNTER — Encounter: Payer: Self-pay | Admitting: Physician Assistant

## 2024-04-01 ENCOUNTER — Ambulatory Visit: Admitting: Physician Assistant

## 2024-04-01 VITALS — BP 106/60 | HR 54 | Ht 67.0 in | Wt 178.0 lb

## 2024-04-01 DIAGNOSIS — Z23 Encounter for immunization: Secondary | ICD-10-CM

## 2024-04-01 DIAGNOSIS — E039 Hypothyroidism, unspecified: Secondary | ICD-10-CM

## 2024-04-01 DIAGNOSIS — S60311A Abrasion of right thumb, initial encounter: Secondary | ICD-10-CM

## 2024-04-01 DIAGNOSIS — E663 Overweight: Secondary | ICD-10-CM

## 2024-04-01 DIAGNOSIS — F321 Major depressive disorder, single episode, moderate: Secondary | ICD-10-CM

## 2024-04-01 DIAGNOSIS — L700 Acne vulgaris: Secondary | ICD-10-CM

## 2024-04-01 DIAGNOSIS — F419 Anxiety disorder, unspecified: Secondary | ICD-10-CM

## 2024-04-01 MED ORDER — CLONAZEPAM 0.5 MG PO TABS: 0.5000 mg | ORAL_TABLET | Freq: Two times a day (BID) | ORAL | 0 refills | Status: AC | PRN

## 2024-04-01 MED ORDER — ADAPALENE 0.3 % EX GEL: CUTANEOUS | 5 refills | Status: AC

## 2024-04-01 NOTE — Patient Instructions (Signed)
 Creatine is a great supplement 1-3mg  a day.  Get mammogram.

## 2024-04-01 NOTE — Progress Notes (Signed)
 "  Established Patient Office Visit  Subjective   Patient ID: Kristy Jones, female    DOB: [birth year]  Age: 69 y.o. MRN: [id number]  Chief Complaint  Patient presents with   Medical Management of Chronic Issues    HPI Discussed the use of AI scribe software for clinical note transcription with the patient, who gave verbal consent to proceed.  History of Present Illness Kristy Jones is a 69 year old female who presents for a routine follow-up visit.  Psychosocial stress - Ongoing stress related to family situation, particularly concerning her oldest son who is currently incarcerated - Concern regarding son's upcoming release in March and his acceptance into a two-year voluntary addiction program - Worries about son's mental health and history of non-compliance with bipolar disorder medication  Mood and anxiety symptoms - Takes Cymbalta  20 mg daily for mood stabilization - Uses Klonopin  at night for anxiety management  Nutritional and supplement use - Uses Elysium, zinc, and vitamin D3 supplements before bed - Takes thyroid  medication in the middle of the night  Weight management - Maintains weight through diet and exercise and Optivia plan.  - Current weight is 178 lbs, with noted discrepancy compared to home scale - Diet plan includes one main meal per day and meal bars as snacks  Musculoskeletal trauma - Recent fall in the snow resulting in a discolored finger and minor abrasion - No significant injuries reported - Uses a cane for stability in icy conditions  Interest in muscle health and energy - Inquires about creatine supplementation for muscle building and energy activation - Expresses interest in maintaining physical health    ROS See HPI.    Objective:     BP 106/60   Pulse (!) 54   Ht 5' 7 (1.702 m)   Wt 178 lb (80.7 kg)   SpO2 99%   BMI 27.88 kg/m  BP Readings from Last 3 Encounters:  04/01/24 106/60  09/30/23 118/66  06/30/23 137/69   Wt  Readings from Last 3 Encounters:  04/01/24 178 lb (80.7 kg)  11/17/23 166 lb (75.3 kg)  09/30/23 172 lb (78 kg)    ..    04/01/2024   11:01 AM 11/17/2023    1:17 PM 10/02/2023    7:01 AM 07/01/2023    6:33 AM 02/27/2023    1:56 PM  Depression screen PHQ 2/9  Decreased Interest 0 0 0 0 0  Down, Depressed, Hopeless 1 0 0 0 0  PHQ - 2 Score 1 0 0 0 0  Altered sleeping 0    0  Tired, decreased energy 0    0  Change in appetite 0    0  Feeling bad or failure about yourself  1    0  Trouble concentrating 0    0  Moving slowly or fidgety/restless 0    0  Suicidal thoughts 0    0  PHQ-9 Score 2    0   Difficult doing work/chores Not difficult at all    Not difficult at all     Data saved with a previous flowsheet row definition   .    04/01/2024   11:01 AM 10/02/2023    7:01 AM 02/27/2023    1:56 PM 11/10/2022    8:52 AM  GAD 7 : Generalized Anxiety Score  Nervous, Anxious, on Edge 1 0  1  2   Control/stop worrying 1 0  0  3   Worry too much - different things 1  0  0  3   Trouble relaxing 1 0  0  2   Restless 0 0  0  0   Easily annoyed or irritable 0 0  0  2   Afraid - awful might happen 1 0  0  3   Total GAD 7 Score 5 0 1 15  Anxiety Difficulty Not difficult at all Not difficult at all Not difficult at all Very difficult     Data saved with a previous flowsheet row definition      Physical Exam HENT:     Head: Normocephalic.  Cardiovascular:     Rate and Rhythm: Normal rate and regular rhythm.  Pulmonary:     Effort: Pulmonary effort is normal.     Breath sounds: Normal breath sounds.  Musculoskeletal:     Cervical back: No tenderness.     Comments: Bruising to index finger of right hand. Abrasion to thumb.   Lymphadenopathy:     Cervical: No cervical adenopathy.  Neurological:     General: No focal deficit present.     Mental Status: She is alert.  Psychiatric:        Mood and Affect: Mood normal.      The 10-year ASCVD risk score (Arnett DK, et al., 2019) is:  5%    Assessment & Plan:  .Shirlette was seen today for medical management of chronic issues.  Diagnoses and all orders for this visit:  Acquired hypothyroidism  Anxiety -     clonazePAM  (KLONOPIN ) 0.5 MG tablet; Take 1 tablet (0.5 mg total) by mouth 2 (two) times daily as needed for anxiety.  Depression, major, single episode, moderate (HCC)  Acne vulgaris -     Adapalene  0.3 % gel; APPLY TOPICALLY EVERY NIGHT AT BEDTIME  Need for Tdap vaccination -     Tdap vaccine greater than or equal to 7yo IM  Abrasion of right thumb, initial encounter -     Tdap vaccine greater than or equal to 7yo IM    Assessment & Plan Major depressive disorder, single episode, moderate Managed with Cymbalta  20 mg daily. Reports feeling good overall despite stress. - Continue Cymbalta  20 mg daily.  Anxiety disorder Managed with Klonopin  nightly. - Continue Klonopin  nightly.  Acquired hypothyroidism Managed with thyroid  medication. Recent labs normal. Endocrinologist fills medication. - Continue current thyroid  medication regimen.  Acne vulgaris - refilled differen  Abrasion - need for Tdap - discussed keeping wound clean and covered  General health maintenance Due for tetanus booster. Discussed creatine supplementation. - Administered tetanus booster. - Consider creatine supplementation, 1-3 grams daily.   Return in about 6 months (around 09/29/2024).    Shawntae Lowy, PA-C  "

## 2024-09-30 ENCOUNTER — Ambulatory Visit: Admitting: Physician Assistant

## 2024-11-17 ENCOUNTER — Ambulatory Visit
